# Patient Record
Sex: Male | Born: 2000 | Race: White | Hispanic: Yes | Marital: Single | Smoking: Never smoker
Health system: Southern US, Community
[De-identification: ages and names within clinical notes are randomized; demographics above are authoritative.]

## PROBLEM LIST (undated history)

## (undated) DIAGNOSIS — F32A Depression, unspecified: Secondary | ICD-10-CM

## (undated) DIAGNOSIS — K219 Gastro-esophageal reflux disease without esophagitis: Secondary | ICD-10-CM

## (undated) DIAGNOSIS — F419 Anxiety disorder, unspecified: Secondary | ICD-10-CM

## (undated) HISTORY — DX: Gastro-esophageal reflux disease without esophagitis: K21.9

## (undated) HISTORY — DX: Anxiety disorder, unspecified: F41.9

## (undated) HISTORY — PX: WISDOM TOOTH EXTRACTION: SHX21

## (undated) HISTORY — DX: Depression, unspecified: F32.A

---
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
## 2018-10-05 ENCOUNTER — Emergency Department
Admission: EM | Admit: 2018-10-05 | Discharge: 2018-10-06 | Disposition: A | Discharge status: Home / Self Care | Payer: Medicaid Other | Attending: Emergency Medicine | Admitting: Emergency Medicine

## 2018-10-05 ENCOUNTER — Other Ambulatory Visit: Payer: Self-pay

## 2018-10-05 DIAGNOSIS — J060 Acute laryngopharyngitis: Secondary | ICD-10-CM | POA: Diagnosis not present

## 2018-10-05 DIAGNOSIS — R070 Pain in throat: Secondary | ICD-10-CM | POA: Diagnosis present

## 2018-10-05 LAB — GROUP A STREP BY PCR: Group A Strep by PCR: NOT DETECTED

## 2018-10-05 NOTE — ED Triage Notes (Signed)
Pt states sore throat for one week. Pt with hoarse voice noted. Pt states he believes he has had a fever this week. Red tonsils noted.

## 2018-10-06 ENCOUNTER — Emergency Department: Payer: Medicaid Other

## 2018-10-06 MED ORDER — ACETAMINOPHEN 500 MG PO TABS
1000.0000 mg | ORAL_TABLET | Freq: Once | ORAL | Status: AC
  Administered 2018-10-06: 1000 mg via ORAL
  Filled 2018-10-06: qty 2

## 2018-10-06 MED ORDER — IBUPROFEN 600 MG PO TABS
600.0000 mg | ORAL_TABLET | Freq: Once | ORAL | Status: AC
  Administered 2018-10-06: 600 mg via ORAL
  Filled 2018-10-06: qty 1

## 2018-10-06 MED ORDER — OXYMETAZOLINE HCL 0.05 % NA SOLN
1.0000 | Freq: Once | NASAL | Status: AC
  Administered 2018-10-06: 1 via NASAL
  Filled 2018-10-06: qty 15

## 2018-10-06 MED ORDER — DEXAMETHASONE 4 MG PO TABS
12.0000 mg | ORAL_TABLET | Freq: Once | ORAL | Status: AC
  Administered 2018-10-06: 12 mg via ORAL
  Filled 2018-10-06: qty 3

## 2018-10-06 NOTE — ED Provider Notes (Signed)
[facility] Emergency Department Provider Note  ____________________________________________   First MD Initiated Contact with Patient 10/06/18 [phone]     (approximate)  I have reviewed the triage vital signs and the nursing notes.   HISTORY  Chief Complaint Sore Throat   HPI Evan Leach is a 17 y.o. male who self presents to the emergency department with roughly 1 week of hoarse voice and dry cough.  He says he has felt febrile earlier this week although has not measured a temperature at home as he does not have a thermometer.  Has had dry cough that is intermittent but seems to be worse at night.  His pain in his throat is sharp aching moderate severity worse with swallowing and improved when not.  He has no past medical history and takes no medications.  He is fully vaccinated.    No past medical history on file.  There are no active problems to display for this patient.     Prior to Admission medications   Not on File    Allergies Patient has no known allergies.  No family history on file.  Social History Social History   Tobacco Use  . Smoking status: Not on file  Substance Use Topics  . Alcohol use: Not on file  . Drug use: Not on file    Review of Systems Constitutional: Positive for fevers negative for chills Eyes: No visual changes. ENT: Positive for sore throat. Cardiovascular: Denies chest pain. Respiratory: Positive for cough negative for shortness of breath Gastrointestinal: No abdominal pain.  No nausea, no vomiting.   Genitourinary: Negative for dysuria. Musculoskeletal: Negative for back pain. Skin: Negative for rash. Neurological: Positive for headache   ____________________________________________   PHYSICAL EXAM:  VITAL SIGNS: ED Triage Vitals  Enc Vitals Group     BP 10/05/18 2256 (!) 144/90     Pulse Rate 10/05/18 2256 (!) 52     Resp 10/05/18 2256 16     Temp 10/05/18 2256 98.3 F (36.8 C)     Temp  Source 10/05/18 2256 Oral     SpO2 10/05/18 2256 99 %     Weight 10/05/18 2257 185 lb (83.9 kg)     Height 10/05/18 2257 5\' 9"  (1.753 m)     Head Circumference --      Peak Flow --      Pain Score 10/05/18 2257 6     Pain Loc --      Pain Edu? --      Excl. in GC? --     Constitutional: Alert and oriented x4 speaks in full clear sentences although with hoarse sounding voice.  No drooling.  No diaphoresis Eyes: PERRL EOMI. Head: Atraumatic. Nose: No congestion/rhinnorhea. Mouth/Throat: No trismus uvula midline mild pharyngeal erythema with no exudate.  Mild anterior lymphadenopathy Neck: No stridor.   Cardiovascular: Bradycardic rate, regular rhythm. Grossly normal heart sounds.  Good peripheral circulation. Respiratory: Normal respiratory effort.  No retractions. Lungs CTAB and moving good air Gastrointestinal: Soft nontender Musculoskeletal: No lower extremity edema   Neurologic:  Normal speech and language. No gross focal neurologic deficits are appreciated. Skin:  Skin is warm, dry and intact. No rash noted. Psychiatric: Mood and affect are normal. Speech and behavior are normal.    ____________________________________________   DIFFERENTIAL includes but not limited to  Strep pharyngitis, viral pharyngitis, mononucleosis, pneumonia, upper respiratory tract infection ____________________________________________   LABS (all labs ordered are listed, but only abnormal results are displayed)  Labs Reviewed  GROUP A STREP BY PCR    Lab work reviewed by me is negative for strep  __________________________________________  EKG   ____________________________________________  RADIOLOGY  X-ray reviewed by me with no acute disease ____________________________________________   PROCEDURES  Procedure(s) performed: no  Procedures  Critical Care performed: no  ____________________________________________   INITIAL IMPRESSION / ASSESSMENT AND PLAN / ED  COURSE  Pertinent labs & imaging results that were available during my care of the patient were reviewed by me and considered in my medical decision making (see chart for details).   As part of my medical decision making, I reviewed the following data within the electronic MEDICAL RECORD NUMBER History obtained from family if available, nursing notes, old chart and ekg, as well as notes from prior ED visits.  Patient comes to the emergency department with 1 week of dry cough subjective fever and sore throat.  He is strep negative.  Given the duration of his symptoms I obtained a chest x-ray which is negative for consolidation.  Given ibuprofen Tylenol and dexamethasone along with Afrin for his congestion with improvement in symptoms.  Educated on the predicted clinical course and strict return precautions have been given.      ____________________________________________   FINAL CLINICAL IMPRESSION(S) / ED DIAGNOSES  Final diagnoses:  Acute laryngopharyngitis      NEW MEDICATIONS STARTED DURING THIS VISIT:  There are no discharge medications for this patient.    Note:  This document was prepared using Dragon voice recognition software and may include unintentional dictation errors.     Merrily Brittleifenbark, Courtany Mcmurphy, MD 10/07/18 1109

## 2018-10-06 NOTE — ED Notes (Signed)
Pt to xray

## 2018-10-06 NOTE — Discharge Instructions (Signed)
It was a pleasure to take care of you today, and thank you for coming to our emergency department.  If you have any questions or concerns before leaving please ask the nurse to grab me and I'm more than happy to go through your aftercare instructions again.  If you have any concerns once you are home that you are not improving or are in fact getting worse before you can make it to your follow-up appointment, please do not hesitate to call [phone] and come back for further evaluation.  Merrily BrittleNeil Lanise Mergen, MD  Results for orders placed or performed during the hospital encounter of 10/05/18  Group A Strep by PCR  Result Value Ref Range   Group A Strep by PCR NOT DETECTED NOT DETECTED   Dg Chest 2 View  Result Date: 10/06/2018 CLINICAL DATA:  Initial evaluation for acute shortness of breath, concern for pneumonia. EXAM: CHEST - 2 VIEW COMPARISON:  None. FINDINGS: The cardiac and mediastinal silhouettes are within normal limits. The lungs are normally inflated. No airspace consolidation, pleural effusion, or pulmonary edema is identified. There is no pneumothorax. No acute osseous abnormality identified. IMPRESSION: No radiographic evidence for active cardiopulmonary disease. Electronically Signed   By: Rise MuBenjamin  McClintock M.D.   On: 10/06/2018 01:31

## 2018-10-06 NOTE — ED Notes (Signed)
Pt returned from xray

## 2018-11-28 ENCOUNTER — Encounter: Payer: Self-pay | Admitting: Adult Health

## 2018-11-28 ENCOUNTER — Encounter (INDEPENDENT_AMBULATORY_CARE_PROVIDER_SITE_OTHER): Payer: Self-pay

## 2018-11-28 ENCOUNTER — Ambulatory Visit (INDEPENDENT_AMBULATORY_CARE_PROVIDER_SITE_OTHER): Admitting: Adult Health

## 2018-11-28 VITALS — BP 110/70 | HR 86 | Resp 16 | Ht 70.0 in | Wt 189.0 lb

## 2018-11-28 DIAGNOSIS — M5441 Lumbago with sciatica, right side: Secondary | ICD-10-CM

## 2018-11-28 DIAGNOSIS — M898X6 Other specified disorders of bone, lower leg: Secondary | ICD-10-CM | POA: Diagnosis not present

## 2018-11-28 DIAGNOSIS — G8929 Other chronic pain: Secondary | ICD-10-CM

## 2018-11-28 DIAGNOSIS — M25571 Pain in right ankle and joints of right foot: Secondary | ICD-10-CM

## 2018-11-28 DIAGNOSIS — Z113 Encounter for screening for infections with a predominantly sexual mode of transmission: Secondary | ICD-10-CM

## 2018-11-28 DIAGNOSIS — M5442 Lumbago with sciatica, left side: Secondary | ICD-10-CM | POA: Diagnosis not present

## 2018-11-28 DIAGNOSIS — M25572 Pain in left ankle and joints of left foot: Secondary | ICD-10-CM

## 2018-11-28 NOTE — Progress Notes (Signed)
[facility] [address] [place], [place] [address]  Internal MEDICINE  Office Visit Note  Patient Name: Evan Leach  [id number]  [id number]  Date of Service: 12/15/2018   Complaints/HPI Pt is here for establishment of PCP. Chief Complaint  Patient presents with  . New Patient (Initial Visit)  . Knee Pain    both  . Back Pain    going on for 1 year  . Foot Pain   HPI Patient is here to establish care with our practice.  He is a 18 year old male who is currently serving in the [employer].  He reports he has had chronic back pain since November 2018.  It is intermittent at times however is fairly consistent especially with weather changes.  He reports bilateral foot pain as well as bilateral shin pain over his tibias.  This is all been ongoing and he want to make sure it is in his medical record due to serving in the [employer].  He also reports that he is planning to marry his fiance in the next year and he would like to be tested for sickle transmitted infections at this visit also.    Current Medication: No outpatient encounter medications on file as of 11/28/2018.   No facility-administered encounter medications on file as of 11/28/2018.     Surgical History:   Medical History: No past medical history on file.  Family History: Family History  Problem Relation Age of Onset  . Diabetes Maternal Grandmother   . Diabetes Maternal Grandfather   . Diabetes Paternal Grandmother     Social History   Socioeconomic History  . Marital status: Single    Spouse name: Not on file  . Number of children: Not on file  . Years of education: Not on file  . Highest education level: Not on file  Occupational History  . Not on file  Social Needs  . Financial resource strain: Not on file  . Food insecurity:    Worry: Not on file    Inability: Not on file  . Transportation needs:    Medical: Not on file    Non-medical: Not on file  Tobacco Use  . Smoking  status: Never Smoker  . Smokeless tobacco: Never Used  Substance and Sexual Activity  . Alcohol use: Yes    Comment: social  . Drug use: Never  . Sexual activity: Not on file  Lifestyle  . Physical activity:    Days per week: Not on file    Minutes per session: Not on file  . Stress: Not on file  Relationships  . Social connections:    Talks on phone: Not on file    Gets together: Not on file    Attends religious service: Not on file    Active member of club or organization: Not on file    Attends meetings of clubs or organizations: Not on file    Relationship status: Not on file  . Intimate partner violence:    Fear of current or ex partner: Not on file    Emotionally abused: Not on file    Physically abused: Not on file    Forced sexual activity: Not on file  Other Topics Concern  . Not on file  Social History Narrative  . Not on file     Review of Systems  Constitutional: Negative.  Negative for chills, fatigue and unexpected weight change.  HENT: Negative.  Negative for congestion, rhinorrhea, sneezing and sore throat.  Eyes: Negative for redness.  Respiratory: Negative.  Negative for cough, chest tightness and shortness of breath.   Cardiovascular: Negative.  Negative for chest pain and palpitations.  Gastrointestinal: Negative.  Negative for abdominal pain, constipation, diarrhea, nausea and vomiting.  Endocrine: Negative.   Genitourinary: Negative.  Negative for dysuria and frequency.  Musculoskeletal: Negative.  Negative for arthralgias, back pain, joint swelling and neck pain.  Skin: Negative.  Negative for rash.  Allergic/Immunologic: Negative.   Neurological: Negative.  Negative for tremors and numbness.  Hematological: Negative for adenopathy. Does not bruise/bleed easily.  Psychiatric/Behavioral: Negative.  Negative for behavioral problems, sleep disturbance and suicidal ideas. The patient is not nervous/anxious.     Vital Signs: BP 110/70   Pulse 86    Resp 16   Ht 5\' 10"  (1.778 m)   Wt 189 lb (85.7 kg)   SpO2 98%   BMI 27.12 kg/m    Physical Exam Vitals signs and nursing note reviewed.  Constitutional:      General: He is not in acute distress.    Appearance: He is well-developed. He is not diaphoretic.  HENT:     Head: Normocephalic and atraumatic.     Mouth/Throat:     Pharynx: No oropharyngeal exudate.  Eyes:     Pupils: Pupils are equal, round, and reactive to light.  Neck:     Musculoskeletal: Normal range of motion and neck supple.     Thyroid: No thyromegaly.     Vascular: No JVD.     Trachea: No tracheal deviation.  Cardiovascular:     Rate and Rhythm: Normal rate and regular rhythm.     Heart sounds: Normal heart sounds. No murmur. No friction rub. No gallop.   Pulmonary:     Effort: Pulmonary effort is normal. No respiratory distress.     Breath sounds: Normal breath sounds. No wheezing or rales.  Chest:     Chest wall: No tenderness.  Abdominal:     Palpations: Abdomen is soft.     Tenderness: There is no abdominal tenderness. There is no guarding.  Musculoskeletal: Normal range of motion.  Lymphadenopathy:     Cervical: No cervical adenopathy.  Skin:    General: Skin is warm and dry.  Neurological:     Mental Status: He is alert and oriented to person, place, and time.     Cranial Nerves: No cranial nerve deficit.  Psychiatric:        Behavior: Behavior normal.        Thought Content: Thought content normal.        Judgment: Judgment normal.    Assessment/Plan: 1. Chronic bilateral low back pain with bilateral sciatica Patient has chronic back pain with bilateral sciatica.  He reports that the pain generally is a 4 out of 10 at its worst.  He does not wish to take any medications at this time and is declining x-ray evaluation currently.  2. Bilateral tibial pain Patient has ongoing intermittent bilateral tibial pain.  14 to monitor in the future.  Patient declined x-rays or further evaluation at  this time  3. Chronic pain of both ankles Patient has intermittent ongoing pain in both his ankles and fee likely due to his [employer], [employer] career.  We will continue to monitor the future.  Patient declined x-rays or evaluation at this time.  4. Encounter for screening examination for sexually transmitted disease - Hepatitis c antibody (reflex) - RPR - HSV(herpes simplex vrs) 1+2 ab-IgG - Chlamydia/Gonococcus/Trichomonas, NAA  General  Counseling: Wallis BambergLarry verbalizes understanding of the findings of todays visit and agrees with plan of treatment. I have discussed any further diagnostic evaluation that may be needed or ordered today. We also reviewed his medications today. he has been encouraged to call the office with any questions or concerns that should arise related to todays visit.  Orders Placed This Encounter  Procedures  . Chlamydia/Gonococcus/Trichomonas, NAA  . Hepatitis c antibody (reflex)  . RPR  . HSV(herpes simplex vrs) 1+2 ab-IgG    No orders of the defined types were placed in this encounter.   Time spent: 25 Minutes   This patient was seen by Blima LedgerAdam Jekhi Bolin AGNP-C in Collaboration with Dr Lyndon CodeFozia M Khan as a part of collaborative care agreement  Johnna AcostaAdam J. Tradarius Reinwald AGNP-C Internal Medicine

## 2018-12-02 LAB — CHLAMYDIA/GONOCOCCUS/TRICHOMONAS, NAA
Chlamydia by NAA: NEGATIVE
Gonococcus by NAA: NEGATIVE
Trich vag by NAA: NEGATIVE

## 2018-12-15 ENCOUNTER — Encounter: Payer: Self-pay | Admitting: Adult Health

## 2018-12-26 ENCOUNTER — Telehealth: Payer: Self-pay | Admitting: Adult Health

## 2018-12-26 NOTE — Telephone Encounter (Signed)
Left message to reschedule 01/10/2019 [email or web address].jw

## 2018-12-28 LAB — LIPID PANEL WITH LDL/HDL RATIO
CHOLESTEROL TOTAL: 133 mg/dL (ref 100–169)
HDL: 24 mg/dL — ABNORMAL LOW (ref >39)
LDL Calculated: 75 mg/dL (ref 0–109)
LDl/HDL Ratio: 3.1 ratio (ref 0.0–3.6)
Triglycerides: 171 mg/dL — ABNORMAL HIGH (ref 0–89)
VLDL CHOLESTEROL CAL: 34 mg/dL (ref 5–40)

## 2018-12-28 LAB — CBC WITH DIFFERENTIAL/PLATELET
BASOS ABS: 0 10*3/uL (ref 0.0–0.2)
Basos: 0 %
EOS (ABSOLUTE): 0.1 10*3/uL (ref 0.0–0.4)
Eos: 1 %
HEMOGLOBIN: 15.8 g/dL (ref 13.0–17.7)
Hematocrit: 45.8 % (ref 37.5–51.0)
Immature Grans (Abs): 0 10*3/uL (ref 0.0–0.1)
Immature Granulocytes: 0 %
LYMPHS: 19 %
Lymphocytes Absolute: 1.7 10*3/uL (ref 0.7–3.1)
MCH: 31.4 pg (ref 26.6–33.0)
MCHC: 34.5 g/dL (ref 31.5–35.7)
MCV: 91 fL (ref 79–97)
MONOCYTES: 7 %
Monocytes Absolute: 0.6 10*3/uL (ref 0.1–0.9)
Neutrophils Absolute: 6.5 10*3/uL (ref 1.4–7.0)
Neutrophils: 73 %
Platelets: 315 10*3/uL (ref 150–450)
RBC: 5.03 x10E6/uL (ref 4.14–5.80)
RDW: 12.6 % (ref 11.6–15.4)
WBC: 8.9 10*3/uL (ref 3.4–10.8)

## 2018-12-28 LAB — COMPREHENSIVE METABOLIC PANEL
ALT: 29 IU/L (ref 0–44)
AST: 29 IU/L (ref 0–40)
Albumin/Globulin Ratio: 2 (ref 1.2–2.2)
Albumin: 5.1 g/dL (ref 4.1–5.2)
Alkaline Phosphatase: 108 IU/L (ref 39–117)
BILIRUBIN TOTAL: 1.3 mg/dL — AB (ref 0.0–1.2)
BUN/Creatinine Ratio: 10 (ref 9–20)
BUN: 9 mg/dL (ref 6–20)
CHLORIDE: 101 mmol/L (ref 96–106)
CO2: 22 mmol/L (ref 20–29)
Calcium: 9.7 mg/dL (ref 8.7–10.2)
Creatinine, Ser: 0.89 mg/dL (ref 0.76–1.27)
GFR calc Af Amer: 143 mL/min/{1.73_m2} (ref >59)
GFR calc non Af Amer: 124 mL/min/{1.73_m2} (ref >59)
GLUCOSE: 78 mg/dL (ref 65–99)
Globulin, Total: 2.5 g/dL (ref 1.5–4.5)
Potassium: 4.6 mmol/L (ref 3.5–5.2)
Sodium: 140 mmol/L (ref 134–144)
Total Protein: 7.6 g/dL (ref 6.0–8.5)

## 2018-12-28 LAB — HEPATITIS C ANTIBODY (REFLEX): HCV Ab: <0.1 s/co ratio (ref 0.0–0.9)

## 2018-12-28 LAB — RPR: RPR Ser Ql: NONREACTIVE

## 2018-12-28 LAB — HCV COMMENT:

## 2018-12-28 LAB — HSV(HERPES SIMPLEX VRS) I + II AB-IGG
HSV 1 Glycoprotein G Ab, IgG: <0.91 index (ref 0.00–0.90)
HSV 2 IgG, Type Spec: <0.91 index (ref 0.00–0.90)

## 2018-12-28 LAB — TSH: TSH: 2.45 u[IU]/mL (ref 0.450–4.500)

## 2018-12-28 LAB — T4, FREE: Free T4: 1.15 ng/dL (ref 0.93–1.60)

## 2018-12-31 ENCOUNTER — Telehealth: Payer: Self-pay | Admitting: Adult Health

## 2018-12-31 NOTE — Telephone Encounter (Signed)
Left message for patient that labs are good , call back with any questions

## 2018-12-31 NOTE — Telephone Encounter (Signed)
-----   Message from Johnna Acosta, NP sent at 12/31/2018  9:15 AM EST ----- Mr. [place] STI testing is all negative.  Syphilis, Gon/Chyla/trich, Herpes 1 and 2

## 2019-01-09 ENCOUNTER — Ambulatory Visit
Admission: RE | Admit: 2019-01-09 | Discharge: 2019-01-09 | Disposition: A | Discharge status: Home / Self Care | Source: Ambulatory Visit | Attending: Adult Health | Admitting: Adult Health

## 2019-01-09 ENCOUNTER — Ambulatory Visit
Admission: RE | Admit: 2019-01-09 | Discharge: 2019-01-09 | Disposition: A | Discharge status: Home / Self Care | Attending: Adult Health | Admitting: Adult Health

## 2019-01-09 ENCOUNTER — Ambulatory Visit (INDEPENDENT_AMBULATORY_CARE_PROVIDER_SITE_OTHER): Admitting: Adult Health

## 2019-01-09 ENCOUNTER — Encounter: Payer: Self-pay | Admitting: Adult Health

## 2019-01-09 ENCOUNTER — Other Ambulatory Visit: Payer: Self-pay

## 2019-01-09 VITALS — BP 110/78 | HR 52 | Resp 16 | Ht 70.0 in | Wt 182.0 lb

## 2019-01-09 DIAGNOSIS — M5442 Lumbago with sciatica, left side: Principal | ICD-10-CM

## 2019-01-09 DIAGNOSIS — Z0001 Encounter for general adult medical examination with abnormal findings: Secondary | ICD-10-CM

## 2019-01-09 DIAGNOSIS — S86899A Other injury of other muscle(s) and tendon(s) at lower leg level, unspecified leg, initial encounter: Secondary | ICD-10-CM

## 2019-01-09 DIAGNOSIS — E785 Hyperlipidemia, unspecified: Secondary | ICD-10-CM | POA: Diagnosis not present

## 2019-01-09 DIAGNOSIS — M5441 Lumbago with sciatica, right side: Secondary | ICD-10-CM

## 2019-01-09 DIAGNOSIS — G8929 Other chronic pain: Secondary | ICD-10-CM

## 2019-01-09 DIAGNOSIS — R300 Dysuria: Secondary | ICD-10-CM

## 2019-01-09 NOTE — Progress Notes (Signed)
[facility] [address] [place], [place] [address]  Internal MEDICINE  Office Visit Note  Patient Name: Evan Leach  [id number]  [id number]  Date of Service: 01/09/2019  Chief Complaint  Patient presents with  . Annual Exam    back pain still there      HPI Pt is here for routine health maintenance examination. PT is a well appearing 18 yo male.  He is employed full time in [employer]. He currently takes not medications and has no significant medical history to speak of. He has been dealing with some ongoing bilateral ankle pain, as well as some lower back pain.  Pt had lab results drawn on 12/27/18, we discussed that his triglycerides are elevated.  He reports eating some chocolate before going in to have his blood drawn.  Will repeat his lipid panel before his next visit. PT also complains of shin splints. He reports he has been running more lately with the [employer] due to some extra training.  He says he has had some issues in the past with this and it usually resolves on its on with proper rest and stretching.   Current Medication: No outpatient encounter medications on file as of 01/09/2019.   No facility-administered encounter medications on file as of 01/09/2019.     Surgical History: Past Surgical History:  Procedure Laterality Date  . WISDOM TOOTH EXTRACTION      Medical History: History reviewed. No pertinent past medical history.  Family History: Family History  Problem Relation Age of Onset  . Diabetes Maternal Grandmother   . Diabetes Maternal Grandfather   . Diabetes Paternal Grandmother       Review of Systems  Constitutional: Negative.  Negative for chills, fatigue and unexpected weight change.  HENT: Negative.  Negative for congestion, rhinorrhea, sneezing and sore throat.   Eyes: Negative for redness.  Respiratory: Negative.  Negative for cough, chest tightness and shortness of breath.   Cardiovascular: Negative.  Negative  for chest pain and palpitations.  Gastrointestinal: Negative.  Negative for abdominal pain, constipation, diarrhea, nausea and vomiting.  Endocrine: Negative.   Genitourinary: Negative.  Negative for dysuria and frequency.  Musculoskeletal: Negative.  Negative for arthralgias, back pain, joint swelling and neck pain.  Skin: Negative.  Negative for rash.  Allergic/Immunologic: Negative.   Neurological: Negative.  Negative for tremors and numbness.  Hematological: Negative for adenopathy. Does not bruise/bleed easily.  Psychiatric/Behavioral: Negative.  Negative for behavioral problems, sleep disturbance and suicidal ideas. The patient is not nervous/anxious.      Vital Signs: BP 110/78 (BP Location: Left Arm, Patient Position: Sitting, Cuff Size: Normal)   Pulse (!) 52   Resp 16   Ht 5\' 10"  (1.778 m)   Wt 182 lb (82.6 kg)   SpO2 97%   BMI 26.11 kg/m    Physical Exam Vitals signs and nursing note reviewed.  Constitutional:      General: He is not in acute distress.    Appearance: He is well-developed. He is not diaphoretic.  HENT:     Head: Normocephalic and atraumatic.     Mouth/Throat:     Pharynx: No oropharyngeal exudate.  Eyes:     Pupils: Pupils are equal, round, and reactive to light.  Neck:     Musculoskeletal: Normal range of motion and neck supple.     Thyroid: No thyromegaly.     Vascular: No JVD.     Trachea: No tracheal deviation.  Cardiovascular:  Rate and Rhythm: Normal rate and regular rhythm.     Heart sounds: Normal heart sounds. No murmur. No friction rub. No gallop.   Pulmonary:     Effort: Pulmonary effort is normal. No respiratory distress.     Breath sounds: Normal breath sounds. No wheezing or rales.  Chest:     Chest wall: No tenderness.  Abdominal:     Palpations: Abdomen is soft.     Tenderness: There is no abdominal tenderness. There is no guarding.     Hernia: There is no hernia in the right inguinal area or left inguinal area.   Genitourinary:    Penis: Normal.      Scrotum/Testes: Cremasteric reflex is present.        Right: Mass, tenderness or swelling not present.        Left: Mass, tenderness or swelling not present.     Epididymis:     Right: Normal.     Left: Normal.  Musculoskeletal: Normal range of motion.  Lymphadenopathy:     Cervical: No cervical adenopathy.     Lower Body: No right inguinal adenopathy. No left inguinal adenopathy.  Skin:    General: Skin is warm and dry.  Neurological:     Mental Status: He is alert and oriented to person, place, and time.     Cranial Nerves: No cranial nerve deficit.  Psychiatric:        Behavior: Behavior normal.        Thought Content: Thought content normal.        Judgment: Judgment normal.      LABS: Recent Results (from the past 2160 hour(s))  Chlamydia/Gonococcus/Trichomonas, NAA     Status: None   Collection Time: 11/28/18  3:43 PM  Result Value Ref Range   Chlamydia by NAA Negative Negative   Gonococcus by NAA Negative Negative   Trich vag by NAA Negative Negative  Hepatitis c antibody (reflex)     Status: None   Collection Time: 12/27/18  9:29 AM  Result Value Ref Range   HCV Ab <0.1 0.0 - 0.9 s/co ratio  RPR     Status: None   Collection Time: 12/27/18  9:29 AM  Result Value Ref Range   RPR Ser Ql Non Reactive Non Reactive  HSV(herpes simplex vrs) 1+2 ab-IgG     Status: None   Collection Time: 12/27/18  9:29 AM  Result Value Ref Range   HSV 1 Glycoprotein G Ab, IgG <0.91 0.00 - 0.90 index    Comment:                                  Negative        <0.91                                  Equivocal 0.91 - 1.09                                  Positive        >1.09  Note: Negative indicates no antibodies detected to  HSV-1. Equivocal may suggest early infection.  If  clinically appropriate, retest at later date. Positive  indicates antibodies detected to HSV-1.    HSV 2 IgG, Type Spec <0.91 0.00 - 0.90 index  Comment:                                   Negative        <0.91                                  Equivocal 0.91 - 1.09                                  Positive        >1.09  Note: Negative indicates no antibodies detected to  HSV-2. Equivocal may suggest early infection.  If  clinically appropriate, retest at later date. Positive  indicates antibodies detected to HSV-2.   CBC with Differential/Platelet     Status: None   Collection Time: 12/27/18  9:29 AM  Result Value Ref Range   WBC 8.9 3.4 - 10.8 x10E3/uL   RBC 5.03 4.14 - 5.80 x10E6/uL   Hemoglobin 15.8 13.0 - 17.7 g/dL   Hematocrit 37.2 90.2 - 51.0 %   MCV 91 79 - 97 fL   MCH 31.4 26.6 - 33.0 pg   MCHC 34.5 31.5 - 35.7 g/dL   RDW 11.1 55.2 - 08.0 %   Platelets 315 150 - 450 x10E3/uL   Neutrophils 73 Not Estab. %   Lymphs 19 Not Estab. %   Monocytes 7 Not Estab. %   Eos 1 Not Estab. %   Basos 0 Not Estab. %   Neutrophils Absolute 6.5 1.4 - 7.0 x10E3/uL   Lymphocytes Absolute 1.7 0.7 - 3.1 x10E3/uL   Monocytes Absolute 0.6 0.1 - 0.9 x10E3/uL   EOS (ABSOLUTE) 0.1 0.0 - 0.4 x10E3/uL   Basophils Absolute 0.0 0.0 - 0.2 x10E3/uL   Immature Granulocytes 0 Not Estab. %   Immature Grans (Abs) 0.0 0.0 - 0.1 x10E3/uL  Comprehensive metabolic panel     Status: Abnormal   Collection Time: 12/27/18  9:29 AM  Result Value Ref Range   Glucose 78 65 - 99 mg/dL   BUN 9 6 - 20 mg/dL   Creatinine, Ser 2.23 0.76 - 1.27 mg/dL   GFR calc non Af Amer 124 >59 mL/min/1.73   GFR calc Af Amer 143 >59 mL/min/1.73   BUN/Creatinine Ratio 10 9 - 20   Sodium 140 134 - 144 mmol/L   Potassium 4.6 3.5 - 5.2 mmol/L   Chloride 101 96 - 106 mmol/L   CO2 22 20 - 29 mmol/L   Calcium 9.7 8.7 - 10.2 mg/dL   Total Protein 7.6 6.0 - 8.5 g/dL   Albumin 5.1 4.1 - 5.2 g/dL    Comment:               **Please note reference interval change**   Globulin, Total 2.5 1.5 - 4.5 g/dL   Albumin/Globulin Ratio 2.0 1.2 - 2.2   Bilirubin Total 1.3 (H) 0.0 - 1.2 mg/dL   Alkaline Phosphatase  108 39 - 117 IU/L   AST 29 0 - 40 IU/L   ALT 29 0 - 44 IU/L  Lipid Panel With LDL/HDL Ratio     Status: Abnormal   Collection Time: 12/27/18  9:29 AM  Result Value Ref Range   Cholesterol, Total 133 100 - 169 mg/dL   Triglycerides 361 (H) 0 - 89 mg/dL  HDL 24 (L) >39 mg/dL   VLDL Cholesterol Cal 34 5 - 40 mg/dL   LDL Calculated 75 0 - 109 mg/dL   LDl/HDL Ratio 3.1 0.0 - 3.6 ratio    Comment:                                     LDL/HDL Ratio                                             Men  Women                               1/2 Avg.Risk  1.0    1.5                                   Avg.Risk  3.6    3.2                                2X Avg.Risk  6.2    5.0                                3X Avg.Risk  8.0    6.1   T4, free     Status: None   Collection Time: 12/27/18  9:29 AM  Result Value Ref Range   Free T4 1.15 0.93 - 1.60 ng/dL  TSH     Status: None   Collection Time: 12/27/18  9:29 AM  Result Value Ref Range   TSH 2.450 0.450 - 4.500 uIU/mL  HCV Comment:     Status: None   Collection Time: 12/27/18  9:29 AM  Result Value Ref Range   Comment: Comment     Comment: Non reactive HCV antibody screen is consistent with no HCV infection, unless recent infection is suspected or other evidence exists to indicate HCV infection.     Assessment/Plan: 1. Encounter for general adult medical examination with abnormal findings PT is up to date on PHM.   2. Chronic bilateral low back pain with bilateral sciatica Due to patients on going lower back pain, will get xray of lumbar spine.  - DG Lumbar Spine Complete; Future  3. Shin splints, initial encounter We discussed ways to treat and prevent shin splints.  Patient information sheet provided. Discussed R.I.C.E. and stretching before exercising.  Also the importance of rest.   4. Hyperlipidemia, unspecified hyperlipidemia type Pt's triglycerides elevated, will redraw before next visit.   5. Dysuria - UA/M w/rflx Culture,  Routine  General Counseling: kavish lafitte understanding of the findings of todays visit and agrees with plan of treatment. I have discussed any further diagnostic evaluation that may be needed or ordered today. We also reviewed his medications today. he has been encouraged to call the office with any questions or concerns that should arise related to todays visit.   Orders Placed This Encounter  Procedures  . UA/M w/rflx Culture, Routine    No orders of the defined types were placed in this encounter.   Time spent: 25 Minutes  This patient was seen by Orson Gear AGNP-C in Collaboration with Dr Lavera Guise as a part of collaborative care agreement    Kendell Bane AGNP-C Internal Medicine

## 2019-01-09 NOTE — Patient Instructions (Signed)
Shin Splints    Shin splints is a painful condition that is felt in the front or the side of your legs. The muscles, the cord-like structures that connect muscles to bones (tendons), and the thin layer of tissue that covers the shin bone get irritated (inflamed). It can be caused by activities or exercises that are intense. It may also be caused by sports that have sudden starts and stops.  Follow these instructions at home:  Medicines  Take over-the-counter and prescription medicines only as told by your doctor.  Do not drive or use heavy machinery while taking prescription pain medicine.  Injury care         If told, put ice on the painful area. Icing can help to relieve pain and swelling.  Put ice in a plastic bag.  Place a towel between your skin and the bag.  Leave the ice on for 20 minutes, 2-3 times per day.  If told, put heat on the painful area. Do this before exercises, or as told by your doctor. Heat can help to relax your muscles. Use the heat source that your doctor recommends, such as a moist heat pack or a heating pad.  Place a towel between your skin and the heat source.  Leave the heat on for 20-30 minutes.  Take off the heat if your skin gets bright red. This is important.  Massage, stretch, and strengthen the shin area.  Wear compression socks as told by your doctor.  Raise (elevate) your legs above the level of your heart while you are sitting or lying down.  Activity  Rest as needed. Return to activity slowly as told by your doctor.  Do not use your shins to support your body weight when you start exercising again. Try cycling or swimming.  Stop running if you have pain.  Warm up before you exercise.  Run on a flat and firm surface, if possible.  Change the intensity of your exercise slowly.  Increase your running distance slowly. For example, if you are running 5 miles this week, you should only add  mile to your run next week.  General instructions  Wear shoes that have good arch support and  heel support. Your shoes should cushion and support you as you walk or run.  Change your athletic shoes every 6 months, or every 350-450 miles.  Keep all follow-up visits as told by your doctor. This is important.  Contact a doctor if:  You do not feel better.  Your pain gets worse.  You have swelling in your lower leg that gets worse.  Your shin is red and feels warm.  Get help right away if:  You have very bad pain.  You have trouble walking.  Summary  Shin splints is a painful condition that is felt in the front or the side of your legs.  Medicines, rest, and ice may help control pain.  Return to activity slowly as told by your doctor.  Make sure to call your doctor if your problems continue or get worse.  This information is not intended to replace advice given to you by your health care provider. Make sure you discuss any questions you have with your health care provider.  Document Released: 06/28/2011 Document Revised: 11/05/2017 Document Reviewed: 11/05/2017  Elsevier Interactive Patient Education  2019 [employer]

## 2019-01-10 ENCOUNTER — Encounter: Payer: Self-pay | Admitting: Adult Health

## 2019-01-10 LAB — UA/M W/RFLX CULTURE, ROUTINE
Bilirubin, UA: NEGATIVE
Glucose, UA: NEGATIVE
Ketones, UA: NEGATIVE
Leukocytes, UA: NEGATIVE
Nitrite, UA: NEGATIVE
Protein, UA: NEGATIVE
RBC, UA: NEGATIVE
Specific Gravity, UA: 1.007 (ref 1.005–1.030)
Urobilinogen, Ur: 0.2 mg/dL (ref 0.2–1.0)
pH, UA: 8 — ABNORMAL HIGH (ref 5.0–7.5)

## 2019-01-10 LAB — MICROSCOPIC EXAMINATION
Bacteria, UA: NONE SEEN
Casts: NONE SEEN /LPF
Epithelial Cells (non renal): NONE SEEN /HPF (ref 0–10)
RBC, UA: NONE SEEN /HPF (ref 0–2)
WBC, UA: NONE SEEN /HPF (ref 0–5)

## 2019-01-17 ENCOUNTER — Telehealth: Payer: Self-pay | Admitting: Adult Health

## 2019-01-17 NOTE — Telephone Encounter (Signed)
-----   Message from Johnna Acosta, NP sent at 01/16/2019 11:05 AM EDT ----- He has a mild Scoliosis (curvature) of his lumbar spine.  He is in [employer] and likely will not want anything done.  But I can refer him to ortho if he wants.

## 2019-01-17 NOTE — Telephone Encounter (Signed)
PATIENT WAS ADVISED ON XRAY , AND WOULD LIKE TO HAVE REFERRAL TO ORTHO

## 2019-01-20 ENCOUNTER — Other Ambulatory Visit: Payer: Self-pay | Admitting: Adult Health

## 2019-01-20 DIAGNOSIS — M419 Scoliosis, unspecified: Secondary | ICD-10-CM

## 2019-01-20 NOTE — Progress Notes (Signed)
Referral for Orthopedics due to scoliosis on x ray.

## 2019-04-16 ENCOUNTER — Ambulatory Visit: Admitting: Adult Health

## 2019-04-22 ENCOUNTER — Other Ambulatory Visit: Payer: Self-pay

## 2019-04-22 ENCOUNTER — Encounter: Payer: Self-pay | Admitting: Adult Health

## 2019-04-22 ENCOUNTER — Ambulatory Visit (INDEPENDENT_AMBULATORY_CARE_PROVIDER_SITE_OTHER): Admitting: Adult Health

## 2019-04-22 VITALS — Resp 16 | Ht 68.0 in | Wt 182.0 lb

## 2019-04-22 DIAGNOSIS — J988 Other specified respiratory disorders: Secondary | ICD-10-CM | POA: Diagnosis not present

## 2019-04-22 DIAGNOSIS — E781 Pure hyperglyceridemia: Secondary | ICD-10-CM

## 2019-04-22 DIAGNOSIS — M419 Scoliosis, unspecified: Secondary | ICD-10-CM

## 2019-04-22 MED ORDER — AZITHROMYCIN 250 MG PO TABS: ORAL_TABLET | ORAL | 0 refills | Status: DC

## 2019-04-22 MED ORDER — PREDNISONE 10 MG PO TABS: ORAL_TABLET | ORAL | 0 refills | Status: DC

## 2019-04-22 NOTE — Progress Notes (Signed)
[facility] [address] [place], [place] [id number]  Internal MEDICINE  Telephone Visit  Patient Name: Evan CaterLarry Leach  [birth year]  [id number]  Date of Service: 04/22/2019  I connected with the patient at 520 by telephone and verified the patients identity using two identifiers.   I discussed the limitations, risks, security and privacy concerns of performing an evaluation and management service by telephone and the availability of in person appointments. I also discussed with the patient that there may be a patient responsible charge related to the service.  The patient expressed understanding and agrees to proceed.    Chief Complaint  Patient presents with  . Medical Management of Chronic Issues    3 month follow up review xray , cholesterol     HPI  PT is seen via telephone for follow upon x-rays and his cholesterol.  He is also reporting that his fiance tested positive for covid. He is complaining of chest congestion, and some sob intermittently.  He went to [facility] to have a covid test over the weekend, and is awaiting results.  He denies fever, or other symptoms at this time.     Current Medication: No outpatient encounter medications on file as of 04/22/2019.   No facility-administered encounter medications on file as of 04/22/2019.     Surgical History: Past Surgical History:  Procedure Laterality Date  . WISDOM TOOTH EXTRACTION      Medical History: History reviewed. No pertinent past medical history.  Family History: Family History  Problem Relation Age of Onset  . Diabetes Maternal Grandmother   . Diabetes Maternal Grandfather   . Diabetes Paternal Grandmother     Social History   Socioeconomic History  . Marital status: Single    Spouse name: Not on file  . Number of children: Not on file  . Years of education: Not on file  . Highest education level: Not on file  Occupational History  . Not on file  Social Needs  . Financial resource strain: Not  on file  . Food insecurity    Worry: Not on file    Inability: Not on file  . Transportation needs    Medical: Not on file    Non-medical: Not on file  Tobacco Use  . Smoking status: Never Smoker  . Smokeless tobacco: Never Used  Substance and Sexual Activity  . Alcohol use: Yes    Comment: social  . Drug use: Never  . Sexual activity: Not on file  Lifestyle  . Physical activity    Days per week: Not on file    Minutes per session: Not on file  . Stress: Not on file  Relationships  . Social Musicianconnections    Talks on phone: Not on file    Gets together: Not on file    Attends religious service: Not on file    Active member of club or organization: Not on file    Attends meetings of clubs or organizations: Not on file    Relationship status: Not on file  . Intimate partner violence    Fear of current or ex partner: Not on file    Emotionally abused: Not on file    Physically abused: Not on file    Forced sexual activity: Not on file  Other Topics Concern  . Not on file  Social History Narrative  . Not on file      Review of Systems  Constitutional: Negative.  Negative for chills, fatigue and unexpected weight  change.  HENT: Negative.  Negative for congestion, rhinorrhea, sneezing and sore throat.   Eyes: Negative for redness.  Respiratory: Negative.  Negative for cough, chest tightness and shortness of breath.   Cardiovascular: Negative.  Negative for chest pain and palpitations.  Gastrointestinal: Negative.  Negative for abdominal pain, constipation, diarrhea, nausea and vomiting.  Endocrine: Negative.   Genitourinary: Negative.  Negative for dysuria and frequency.  Musculoskeletal: Negative.  Negative for arthralgias, back pain, joint swelling and neck pain.  Skin: Negative.  Negative for rash.  Allergic/Immunologic: Negative.   Neurological: Negative.  Negative for tremors and numbness.  Hematological: Negative for adenopathy. Does not bruise/bleed easily.   Psychiatric/Behavioral: Negative.  Negative for behavioral problems, sleep disturbance and suicidal ideas. The patient is not nervous/anxious.     Vital Signs: Resp 16   Ht 5\' 8"  (1.727 m)   Wt 182 lb (82.6 kg)   BMI 27.67 kg/m    Observation/Objective:  Well appearing.  NAD noted.    Assessment/Plan: 1. Respiratory infection Advised patient to take entire course of antibiotics as prescribed with food. Pt should return to clinic in 7-10 days if symptoms fail to improve or new symptoms develop.  - azithromycin (ZITHROMAX) 250 MG tablet; Take as directed  Dispense: 6 tablet; Refill: 0 - predniSONE (DELTASONE) 10 MG tablet; Use per dose pack  Dispense: 21 tablet; Refill: 0  2. High blood triglycerides Re-ordred patients lipid panel as he has not had a chance to get it done yet.  - Lipid Panel With LDL/HDL Ratio  3. Scoliosis of lumbar spine, unspecified scoliosis type Currently doing physical thearpy.  General Counseling: Evan Leach understanding of the findings of today's phone visit and agrees with plan of treatment. I have discussed any further diagnostic evaluation that may be needed or ordered today. We also reviewed his medications today. he has been encouraged to call the office with any questions or concerns that should arise related to todays visit.    No orders of the defined types were placed in this encounter.   No orders of the defined types were placed in this encounter.   Time spent: [facility] Internal medicine

## 2019-11-08 IMAGING — CR DG CHEST 2V
2 series · 2 of 2 positions shown · non-contrast
Comparison: None.

CLINICAL DATA: Initial evaluation for acute shortness of breath,
concern for pneumonia.

EXAM:
CHEST - 2 VIEW

[chest pa]
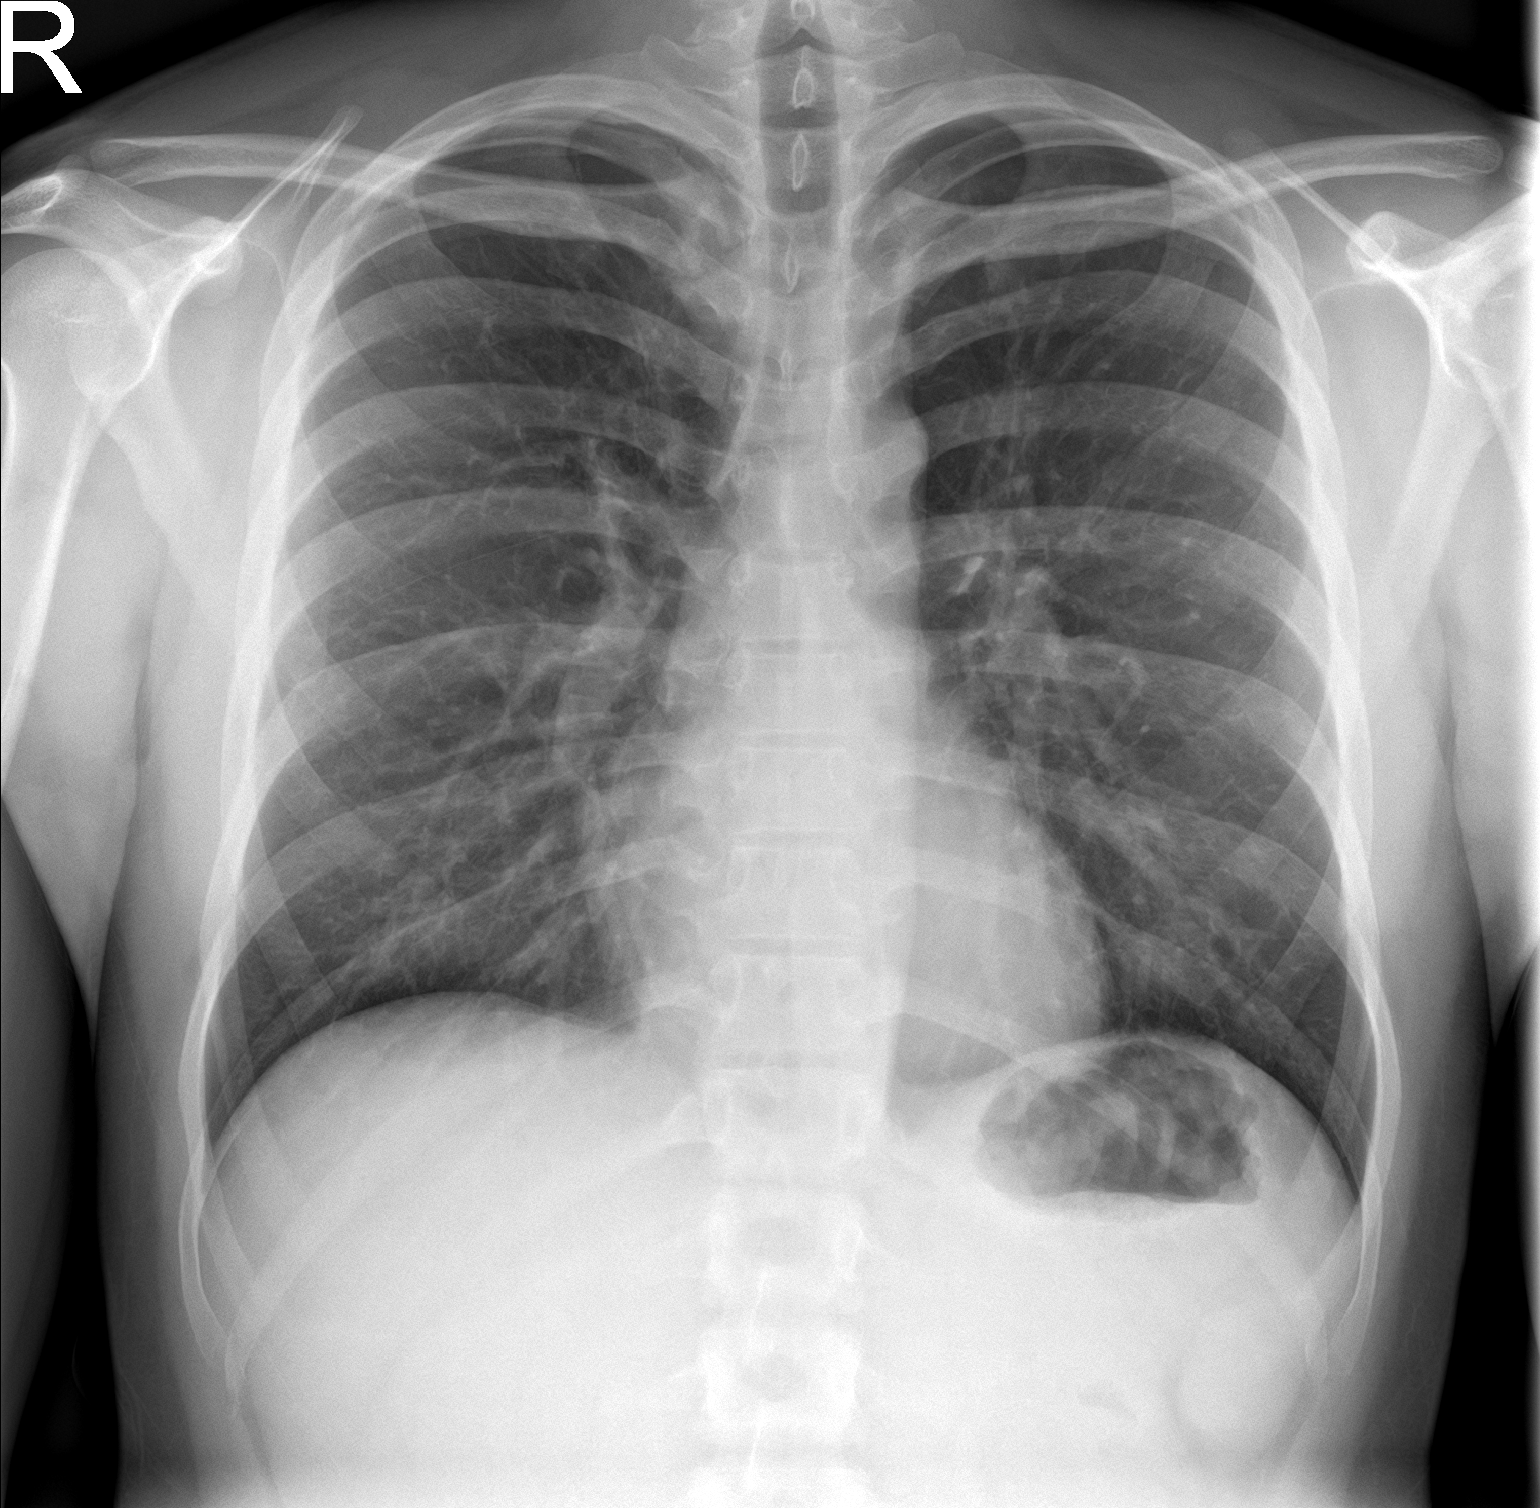

[chest lat]
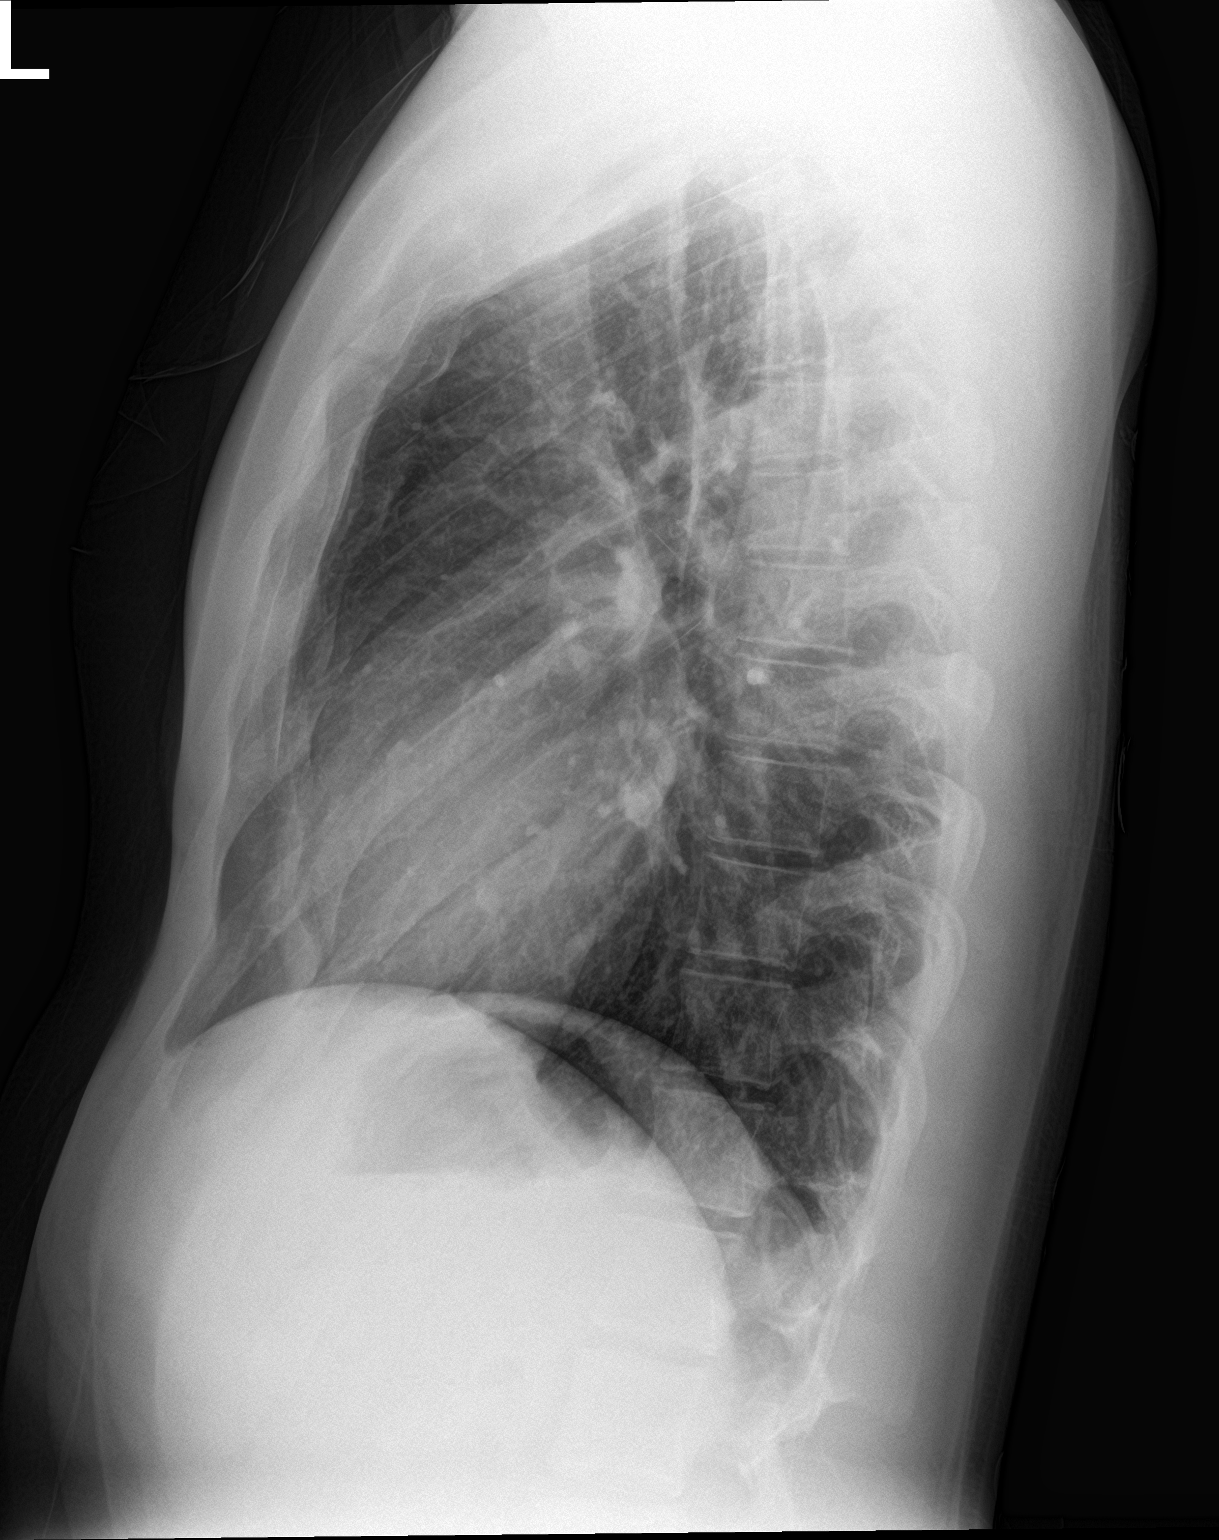

[2 of 2 positions shown; findings below may reference images not displayed]

FINDINGS: The cardiac and mediastinal silhouettes are within normal limits.

The lungs are normally inflated. No airspace consolidation, pleural
effusion, or pulmonary edema is identified. There is no
pneumothorax.

No acute osseous abnormality identified.
IMPRESSION: No radiographic evidence for active cardiopulmonary disease.

## 2019-11-26 ENCOUNTER — Encounter: Payer: Self-pay | Admitting: Adult Health

## 2019-11-26 ENCOUNTER — Ambulatory Visit (INDEPENDENT_AMBULATORY_CARE_PROVIDER_SITE_OTHER): Admitting: Adult Health

## 2019-11-26 ENCOUNTER — Other Ambulatory Visit: Payer: Self-pay

## 2019-11-26 VITALS — BP 125/80 | HR 58 | Temp 97.5°F | Ht 69.0 in | Wt 184.0 lb

## 2019-11-26 DIAGNOSIS — M5441 Lumbago with sciatica, right side: Secondary | ICD-10-CM | POA: Diagnosis not present

## 2019-11-26 DIAGNOSIS — G8929 Other chronic pain: Secondary | ICD-10-CM

## 2019-11-26 DIAGNOSIS — R222 Localized swelling, mass and lump, trunk: Secondary | ICD-10-CM

## 2019-11-26 DIAGNOSIS — M5442 Lumbago with sciatica, left side: Secondary | ICD-10-CM

## 2019-11-26 DIAGNOSIS — R2230 Localized swelling, mass and lump, unspecified upper limb: Secondary | ICD-10-CM

## 2019-11-26 NOTE — Progress Notes (Signed)
[facility] [address] [place], [place] [address]  Internal MEDICINE  Telephone Visit  Patient Name: Evan Leach  [id number]  [id number]  Date of Service: 11/27/2019  I connected with the patient at 353 by telephone and verified the patients identity using two identifiers.   I discussed the limitations, risks, security and privacy concerns of performing an evaluation and management service by telephone and the availability of in person appointments. I also discussed with the patient that there may be a patient responsible charge related to the service.  The patient expressed understanding and agrees to proceed.    Chief Complaint  Patient presents with  . Telephone Assessment    need referral for physical therapy   . Telephone Screen  . Pain    both side under armpit     HPI  Pt seen via video. Pt reports for awhile now he has been having intermittent underarm tenderness. He reports feeling like there is something under the skin.  He denies any Rash, dryness, or redness. He also is reporting low back pain that has been going on for some time.  He started physical therapy but due to covid, he would like a new referral.     Current Medication: Outpatient Encounter Medications as of 11/26/2019  Medication Sig  . [DISCONTINUED] azithromycin (ZITHROMAX) 250 MG tablet Take as directed  . [DISCONTINUED] predniSONE (DELTASONE) 10 MG tablet Use per dose pack   No facility-administered encounter medications on file as of 11/26/2019.    Surgical History: Past Surgical History:  Procedure Laterality Date  . WISDOM TOOTH EXTRACTION      Medical History: History reviewed. No pertinent past medical history.  Family History: Family History  Problem Relation Age of Onset  . Diabetes Maternal Grandmother   . Diabetes Maternal Grandfather   . Diabetes Paternal Grandmother     Social History   Socioeconomic History  . Marital status: Single    Spouse name: Not on file   . Number of children: Not on file  . Years of education: Not on file  . Highest education level: Not on file  Occupational History  . Not on file  Tobacco Use  . Smoking status: Never Smoker  . Smokeless tobacco: Never Used  Substance and Sexual Activity  . Alcohol use: Yes    Comment: social  . Drug use: Never  . Sexual activity: Not on file  Other Topics Concern  . Not on file  Social History Narrative  . Not on file   Social Determinants of Health   Financial Resource Strain:   . Difficulty of Paying Living Expenses: Not on file  Food Insecurity:   . Worried About Programme researcher, broadcasting/film/video in the Last Year: Not on file  . Ran Out of Food in the Last Year: Not on file  Transportation Needs:   . Lack of Transportation (Medical): Not on file  . Lack of Transportation (Non-Medical): Not on file  Physical Activity:   . Days of Exercise per Week: Not on file  . Minutes of Exercise per Session: Not on file  Stress:   . Feeling of Stress : Not on file  Social Connections:   . Frequency of Communication with Friends and Family: Not on file  . Frequency of Social Gatherings with Friends and Family: Not on file  . Attends Religious Services: Not on file  . Active Member of Clubs or Organizations: Not on file  . Attends Banker Meetings: Not  on file  . Marital Status: Not on file  Intimate Partner Violence:   . Fear of Current or Ex-Partner: Not on file  . Emotionally Abused: Not on file  . Physically Abused: Not on file  . Sexually Abused: Not on file      Review of Systems  Constitutional: Negative.  Negative for chills, fatigue and unexpected weight change.  HENT: Negative.  Negative for congestion, rhinorrhea, sneezing and sore throat.   Eyes: Negative for redness.  Respiratory: Negative.  Negative for cough, chest tightness and shortness of breath.   Cardiovascular: Negative.  Negative for chest pain and palpitations.  Gastrointestinal: Negative.  Negative  for abdominal pain, constipation, diarrhea, nausea and vomiting.  Endocrine: Negative.   Genitourinary: Negative.  Negative for dysuria and frequency.  Musculoskeletal: Negative.  Negative for arthralgias, back pain, joint swelling and neck pain.  Skin: Negative.  Negative for rash.  Allergic/Immunologic: Negative.   Neurological: Negative.  Negative for tremors and numbness.  Hematological: Negative for adenopathy. Does not bruise/bleed easily.  Psychiatric/Behavioral: Negative.  Negative for behavioral problems, sleep disturbance and suicidal ideas. The patient is not nervous/anxious.     Vital Signs: BP 125/80   Pulse (!) 58   Temp (!) 97.5 F (36.4 C)   Ht 5\' 9"  (1.753 m)   Wt 184 lb (83.5 kg)   SpO2 98%   BMI 27.17 kg/m    Observation/Objective:  Well appearing, NAD noted.    Assessment/Plan: 1. Chronic bilateral low back pain with bilateral sciatica Have physical therapy evaluate and treat. - Ambulatory referral to Physical Therapy  2. Axillary fullness Change deodorant, and do not wear shirts that are tight in axilla.  It continued issues return to clinic.  3. Labs ordered for physical - CBC with Differential/Platelet - Lipid Panel With LDL/HDL Ratio - TSH - T4, free - Comprehensive metabolic panel   General Counseling: dewaine morocho understanding of the findings of today's phone visit and agrees with plan of treatment. I have discussed any further diagnostic evaluation that may be needed or ordered today. We also reviewed his medications today. he has been encouraged to call the office with any questions or concerns that should arise related to todays visit.    Orders Placed This Encounter  Procedures  . CBC with Differential/Platelet  . Lipid Panel With LDL/HDL Ratio  . TSH  . T4, free  . Comprehensive metabolic panel  . Ambulatory referral to Physical Therapy    No orders of the defined types were placed in this encounter.   Time spent: [facility] Internal medicine

## 2019-12-02 ENCOUNTER — Ambulatory Visit

## 2019-12-03 ENCOUNTER — Other Ambulatory Visit: Payer: Self-pay

## 2019-12-03 ENCOUNTER — Ambulatory Visit: Attending: Adult Health

## 2019-12-03 DIAGNOSIS — M6281 Muscle weakness (generalized): Secondary | ICD-10-CM

## 2019-12-03 DIAGNOSIS — M546 Pain in thoracic spine: Secondary | ICD-10-CM | POA: Diagnosis present

## 2019-12-03 DIAGNOSIS — G8929 Other chronic pain: Secondary | ICD-10-CM

## 2019-12-03 DIAGNOSIS — M545 Low back pain: Secondary | ICD-10-CM | POA: Insufficient documentation

## 2019-12-03 NOTE — Therapy (Signed)
[facility] [address], [place], [address] Phone: [phone]   Fax:  [phone]  Physical Therapy Evaluation  Patient Details  Name: Evan Leach MRN: [id number] Date of Birth: November 02, 2000 Referring Provider (PT): Orson Gear, NP   Encounter Date: 12/03/2019  PT End of Session - 12/03/19 1603    Visit Number  1    Number of Visits  17    Date for PT Re-Evaluation  01/29/20    PT Start Time  0867    PT Stop Time  1702    PT Time Calculation (min)  57 min    Activity Tolerance  Patient tolerated treatment well    Behavior During Therapy  [facility] for tasks assessed/performed       No past medical history on file.  Past Surgical History:  Procedure Laterality Date  . WISDOM TOOTH EXTRACTION      There were no vitals filed for this visit.   Subjective Assessment - 12/03/19 1606    Subjective  low back: 4/10 currenlty (pt sitting), 6/10 at most for the past 3 months. Mid thoracic: 0/10 currently, 7/10 at most for the past 3 months. L hip: 0/10 currently, 6/10 at most for the past 3 months.    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 Pain began 2 years ago, gradual onset. Pain began in low back which can pop back into place which feels better (usually when walking). Pain has improved since COVID started since pt stopped running 20 miles a week. Currently feels pain between his shoulder blades which began October 2020, while deadlifting (135 lbs). Pt said that he heard cavitation in his thoracic spine at shoulder blade area.  Pt also adds that he feels like a sharp pain at his L hip while walking. Pt popped his hip and it feels better.  Pt used to have B LE pain (both knees, shins, and feet) which happens now and again when he used to run. No longer has pain when pt stopped running.    Patient Stated Goals  Reduce the thoracic pain. Have ways to alleviate the pain when it occurs.    Currently in  Pain?  Yes    Pain Score  4     Pain Location  Back    Pain Orientation  Right;Left;Posterior;Lateral;Upper    Pain Type  Chronic pain    Pain Onset  More than a month ago    Pain Frequency  Occasional    Aggravating Factors   low back: bending over. thoracic spine: twisting while reaching up.    Pain Relieving Factors  low back: Back extension, back extension with rotation; thoracic spine: extension         [facility] PT Assessment - 12/03/19 1624      Assessment   Medical Diagnosis  Chronic bilateral low back pain with bilateral sciatica    Referring Provider (PT)  Orson Gear, NP    Onset Date/Surgical Date  11/26/19   Date PT referral signed   Prior Therapy  No known PT for current condition      Precautions   Precaution Comments  none      Restrictions   Other Position/Activity Restrictions  no known restrictions      Prior Function   Vocation  Full time employment   Presenter, broadcasting Requirements  PLOF: less difficulty performing tasks which involve twisting as well as bending over.  Able to jog 20 miles  comfortably      Observation/Other Assessments   Focus on Therapeutic Outcomes (FOTO)   Lumbar FOTO: 65      Posture/Postural Control   Posture Comments  Protracted neck, B protracted shoulders, R shoulder lower, slight R thoracolumbar convexity, L foot pronation. Decreased lordosis      AROM   Cervical Flexion  Full    Cervical Extension  full    Cervical - Right Side Bend  WFL    Cervical - Left Side Bend  WFL    Cervical - Right Rotation  full    Cervical - Left Rotation  full    Lumbar Flexion  full with low back pressure (same area of pain)    Lumbar Extension  WFL, decreased thoracic extension    low back pressure with PT overpressure   Lumbar - Right Side Bend  WFL   L low back pressure with PT overpressure   Lumbar - Left Side Bend  WFL    Lumbar - Right Rotation  WFL with reproduction of upper thoracic pain    Lumbar - Left Rotation  WFL       PROM   Overall PROM Comments  Hip in 90/90: IR 30 degrees R; 40 degrees L      Strength   Right Hip Flexion  4/5    Right Hip Extension  4/5    Right Hip ABduction  4/5    Left Hip Flexion  4/5    Left Hip Extension  4-/5    Left Hip ABduction  4+/5    Right Knee Flexion  5/5    Right Knee Extension  4+/5    Left Knee Flexion  5/5    Left Knee Extension  4+/5    Right Ankle Dorsiflexion  4+/5    Right Ankle Plantar Flexion  4+/5   Manually resisted in sitting   Left Ankle Dorsiflexion  4/5    Left Ankle Plantar Flexion  4+/5   Manually resisted in sitting     Palpation   Palpation comment  Slight decreased mobilility with R UPA to L5-T1 TP. Decreased CPA to mid thoracic spine.       Special Tests   Other special tests  (+) Long sit test suggesting anterior nutation of L innominate.       Ambulation/Gait   Gait Comments  R pelvic drop during L LE stance phase, decreased trunk rotation                Objective measurements completed on examination: See above findings.   No latex allergies X-ray shows mild case of scoliosis per pt.     Try prone on elbows next visit if appropriate.   Low back extension preference  Patient is a 19 year old male who came to physical therapy secondary to back pain. He currently presents with mid thoracic and low back pain, decreased mid thoracic mobility, bilateral hip weakness, positive special test suggesting lumbopelvic involvement, reproduction of symptoms with lumbar flexion as well as R thoracic rotation, and difficulty performing functional tasks which involve bending over as well as with twisting. Pt will benefit from skilled physical therapy services to address the aforementioned deficits.           PT Education - 12/03/19 1825    Education Details  plan of care    Person(s) Educated  Patient    Methods  Explanation    Comprehension  Verbalized understanding  PT Short Term Goals - 12/03/19 1814      PT  SHORT TERM GOAL #1   Title  Patient will be independent with his HEP to decrease pain, improve strength and function.    Time  3    Period  Weeks    Status  New    Target Date  12/26/19        PT Long Term Goals - 12/03/19 1814      PT LONG TERM GOAL #1   Title  Patient will have a decrease in low back pain to 2/10 or less at worst to promote ability to perform tasks which involve bending over more comfortably.    Baseline  6/10 back pain at most for the past 3 months (12/03/2019)    Time  8    Period  Weeks    Status  New    Target Date  01/29/20      PT LONG TERM GOAL #2   Title  Patient will have a decrease in thoracic pain to 3/10 or less at worst to promote ability to perform movements which involve twisting more comfortably.    Baseline  7/10 thoracic pain at most for the past 3 months (12/03/2019)    Time  8    Period  Weeks    Status  New    Target Date  01/29/20      PT LONG TERM GOAL #3   Title  Patient will improve bilateral hip abduction and extension strength by at least 1/2 MMT grade to promote ability to perform functional tasks more comfortably for his back.    Time  8    Period  Weeks    Status  New    Target Date  01/29/20      PT LONG TERM GOAL #4   Title  Patient will improve his lumbar spine FOTO score by at least 10 points as a demonstration of improved function.    Baseline  Lumbar spine FOTO: 65 (12/03/2019)    Time  8    Period  Weeks    Status  New    Target Date  01/29/20             Plan - 12/03/19 1810    Clinical Impression Statement  Patient is a 19 year old male who came to physical therapy secondary to back pain. He currently presents with mid thoracic and low back pain, decreased mid thoracic mobility, bilateral hip weakness, positive special test suggesting lumbopelvic involvement, reproduction of symptoms with lumbar flexion as well as R thoracic rotation, and difficulty performing functional tasks which involve bending over as well as  with twisting. Pt will benefit from skilled physical therapy services to address the aforementioned deficits.    Personal Factors and Comorbidities  Past/Current Experience;Profession;Time since onset of injury/illness/exacerbation    Examination-Activity Limitations  Bend    Stability/Clinical Decision Making  Stable/Uncomplicated    Clinical Decision Making  Low    Rehab Potential  Good    PT Frequency  2x / week    PT Duration  8 weeks    PT Treatment/Interventions  Neuromuscular re-education;Therapeutic activities;Therapeutic exercise;Patient/family education;Manual techniques;Dry needling;Spinal Manipulations;Joint Manipulations;Aquatic Therapy;Electrical Stimulation;Iontophoresis 4mg /ml Dexamethasone;Traction;Ultrasound    PT Next Visit Plan  trunk mobility, scapular, trunk, hip strengthening, lumbopelvic control, manual techniques, modalities PRN    Consulted and Agree with Plan of Care  Patient       Patient will benefit from skilled therapeutic intervention in order  to improve the following deficits and impairments:  Pain, Postural dysfunction, Improper body mechanics, Decreased strength  Visit Diagnosis: Chronic bilateral low back pain, unspecified whether sciatica present - Plan: PT plan of care cert/re-cert  Pain in thoracic spine - Plan: PT plan of care cert/re-cert  Muscle weakness (generalized) - Plan: PT plan of care cert/re-cert     Problem List There are no problems to display for this patient.   Loralyn Freshwater PT, DPT   12/03/2019, 6:28 PM  [facility] PH[facility] AND SPORTS MEDICINE [address], [place], [address] Phone: [phone]   Fax:  [phone]  Name: Nathanyl Andujo MRN: [id number] Date of Birth: November 26, 2000

## 2019-12-09 ENCOUNTER — Ambulatory Visit

## 2019-12-09 ENCOUNTER — Other Ambulatory Visit: Payer: Self-pay

## 2019-12-09 DIAGNOSIS — M545 Low back pain, unspecified: Secondary | ICD-10-CM

## 2019-12-09 DIAGNOSIS — M6281 Muscle weakness (generalized): Secondary | ICD-10-CM

## 2019-12-09 DIAGNOSIS — M546 Pain in thoracic spine: Secondary | ICD-10-CM

## 2019-12-09 DIAGNOSIS — G8929 Other chronic pain: Secondary | ICD-10-CM

## 2019-12-09 NOTE — Patient Instructions (Signed)
Access Code: [email or web address]  URL: [email or web address]  Date: 12/09/2019  Prepared by: Loralyn Freshwater   Exercises Standing Lumbar Extension - 10 reps - 3 sets - 5 seconds hold - 1x daily - 7x weekly Bent Knee Fallouts - 10 reps - 3 sets - 3x daily - 7x weekly

## 2019-12-09 NOTE — Therapy (Signed)
[place] [facility]OR[facility]IC[facility] [address], [place], [address] Phone: [phone]   Fax:  [phone]  Physical Therapy Treatment  Patient Details  Name: Bakari Nikolai MRN: [id number] Date of Birth: 10-16-2001 Referring Provider (PT): Orson Gear, NP   Encounter Date: 12/09/2019  PT End of Session - 12/09/19 1417    Visit Number  2    Number of Visits  17    Date for PT Re-Evaluation  01/29/20    PT Start Time  8546    PT Stop Time  1505    PT Time Calculation (min)  48 min    Activity Tolerance  Patient tolerated treatment well    Behavior During Therapy  [facility] for tasks assessed/performed       No past medical history on file.  Past Surgical History:  Procedure Laterality Date  . WISDOM TOOTH EXTRACTION      There were no vitals filed for this visit.  Subjective Assessment - 12/09/19 1418    Subjective  Low back feels iffy. Pt got out of the car, put on his back pack, stood up and his R hip started bothering him. 0/10 low back and mid back pain currently. Pt feels popping B shoulder when performing abduction.    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 Pain began 2 years ago, gradual onset. Pain began in low back which can pop back into place which feels better (usually when walking). Pain has improved since COVID started since pt stopped running 20 miles a week. Currently feels pain between his shoulder blades which began October 2020, while deadlifting (135 lbs). Pt said that he heard cavitation in his thoracic spine at shoulder blade area.  Pt also adds that he feels like a sharp pain at his L hip while walking. Pt popped his hip and it feels better.  Pt used to have B LE pain (both knees, shins, and feet) which happens now and again when he used to run. No longer has pain when pt stopped running.    Patient Stated Goals  Reduce the thoracic pain. Have ways to alleviate the pain when it occurs.     Currently in Pain?  No/denies    Pain Score  0-No pain    Pain Onset  More than a month ago                               PT Education - 12/09/19 1713    Education Details  ther-ex, HEP    Person(s) Educated  Patient    Methods  Explanation;Demonstration;Tactile cues;Verbal cues    Comprehension  Returned demonstration;Verbalized understanding     Objectives   Medbridge Access Code: [email or web address]  Going to the field from 12/19/19- 12/27/19 and will not be available for PT    Therapeutic exercises   Standing back extension 10x5 seconds for 2 sets  Standing L lateral shift correction 10x5 seconds   Increased symptoms R low back tighness  standing R lateral shift correction 10x5 seconds for 2 sets  No increase in R low back tighness  Standing back extension to the R 7x5 seconds. R low back pinching sensation  Prone glute max/quad set isometrics 10x5 seconds each LE  Low back extension compensation   (+) Eli test L > R for rectus femoris tightness   Supine transversus abdominis contraction with hip fallouts 5x each LE  L pelvic rotation during L hip fallout observed.    Anterior pelvic tilt observed with sit <> stanbd   Improved exercise technique, movement at target joints, use of target muscles after mod verbal, visual, tactile cues.     Manual therapy  Prone R UPA to L3 grade 3- Reproduced symptoms.   Then L UPA to L 3 grade 3-   Response to treatment Pt tolerated session well without aggravation of symptoms.   Clinical presentation  Pt demonstrates difficulty with lumbopelvic strength and control obaserved with movements such as sit <> stand and prone glute set with low back extension compensation. Worked on improving core muscle strengthening as well as promoting lumbopelvic control to help address. Pt tolerated session well without aggravation of symptoms. Pt will benefit from continued skilled physical therapy services to decrease pain,  improve strength and function.            PT Short Term Goals - 12/03/19 1814      PT SHORT TERM GOAL #1   Title  Patient will be independent with his HEP to decrease pain, improve strength and function.    Time  3    Period  Weeks    Status  New    Target Date  12/26/19        PT Long Term Goals - 12/03/19 1814      PT LONG TERM GOAL #1   Title  Patient will have a decrease in low back pain to 2/10 or less at worst to promote ability to perform tasks which involve bending over more comfortably.    Baseline  6/10 back pain at most for the past 3 months (12/03/2019)    Time  8    Period  Weeks    Status  New    Target Date  01/29/20      PT LONG TERM GOAL #2   Title  Patient will have a decrease in thoracic pain to 3/10 or less at worst to promote ability to perform movements which involve twisting more comfortably.    Baseline  7/10 thoracic pain at most for the past 3 months (12/03/2019)    Time  8    Period  Weeks    Status  New    Target Date  01/29/20      PT LONG TERM GOAL #3   Title  Patient will improve bilateral hip abduction and extension strength by at least 1/2 MMT grade to promote ability to perform functional tasks more comfortably for his back.    Time  8    Period  Weeks    Status  New    Target Date  01/29/20      PT LONG TERM GOAL #4   Title  Patient will improve his lumbar spine FOTO score by at least 10 points as a demonstration of improved function.    Baseline  Lumbar spine FOTO: 65 (12/03/2019)    Time  8    Period  Weeks    Status  New    Target Date  01/29/20            Plan - 12/09/19 1711    Clinical Impression Statement  Pt demonstrates difficulty with lumbopelvic strength and control obaserved with movements such as sit <> stand and prone glute set with low back extension compensation. Worked on improving core muscle strengthening as well as promoting lumbopelvic control to help address. Pt tolerated session well without  aggravation of symptoms. Pt  will benefit from continued skilled physical therapy services to decrease pain, improve strength and function.    Personal Factors and Comorbidities  Past/Current Experience;Profession;Time since onset of injury/illness/exacerbation    Examination-Activity Limitations  Bend    Stability/Clinical Decision Making  Stable/Uncomplicated    Rehab Potential  Good    PT Frequency  2x / week    PT Duration  8 weeks    PT Treatment/Interventions  Neuromuscular re-education;Therapeutic activities;Therapeutic exercise;Patient/family education;Manual techniques;Dry needling;Spinal Manipulations;Joint Manipulations;Aquatic Therapy;Electrical Stimulation;Iontophoresis 4mg /ml Dexamethasone;Traction;Ultrasound    PT Next Visit Plan  trunk mobility, scapular, trunk, hip strengthening, lumbopelvic control, manual techniques, modalities PRN    Consulted and Agree with Plan of Care  Patient       Patient will benefit from skilled therapeutic intervention in order to improve the following deficits and impairments:  Pain, Postural dysfunction, Improper body mechanics, Decreased strength  Visit Diagnosis: Chronic bilateral low back pain, unspecified whether sciatica present  Pain in thoracic spine  Muscle weakness (generalized)     Problem List There are no problems to display for this patient.  PT, DPT   12/09/2019, 5:14 PM  [facility] PHYSICAL AND SPORTS MEDICINE [address], [place] Phone: [phone]   Fax:  [phone]  Name: Zac Torti MRN: Chalmers Cater Date of Birth: 04/11/2001

## 2019-12-11 ENCOUNTER — Other Ambulatory Visit: Payer: Self-pay

## 2019-12-11 ENCOUNTER — Ambulatory Visit

## 2019-12-11 DIAGNOSIS — M545 Low back pain, unspecified: Secondary | ICD-10-CM

## 2019-12-11 DIAGNOSIS — G8929 Other chronic pain: Secondary | ICD-10-CM

## 2019-12-11 DIAGNOSIS — M6281 Muscle weakness (generalized): Secondary | ICD-10-CM

## 2019-12-11 DIAGNOSIS — M546 Pain in thoracic spine: Secondary | ICD-10-CM

## 2019-12-11 NOTE — Therapy (Signed)
[place] [facility] [address] [place], [address] Phone: [phone]   Fax:  [phone]  Physical Therapy Treatment  Patient Details  Name: Evan Leach MRN: [id number] Date of Birth: [birth year] Referring Provider (PT): Orson Gear, NP   Encounter Date: 12/11/2019  PT End of Session - 12/11/19 0817    Visit Number  3    Number of Visits  17    Date for PT Re-Evaluation  01/29/20    PT Start Time  0817    PT Stop Time  0859    PT Time Calculation (min)  42 min    Activity Tolerance  Patient tolerated treatment well    Behavior During Therapy  [facility] for tasks assessed/performed       No past medical history on file.  Past Surgical History:  Procedure Laterality Date  . WISDOM TOOTH EXTRACTION      There were no vitals filed for this visit.  Subjective Assessment - 12/11/19 0818    Subjective  Back is the same. No pain currently. No R or L hip pain currently. Got out of his car yesterday with his bag on his back without pain.    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 Pain began 2 years ago, gradual onset. Pain began in low back which can pop back into place which feels better (usually when walking). Pain has improved since COVID started since pt stopped running 20 miles a week. Currently feels pain between his shoulder blades which began October 2020, while deadlifting (135 lbs). Pt said that he heard cavitation in his thoracic spine at shoulder blade area.  Pt also adds that he feels like a sharp pain at his L hip while walking. Pt popped his hip and it feels better.  Pt used to have B LE pain (both knees, shins, and feet) which happens now and again when he used to run. No longer has pain when pt stopped running.    Patient Stated Goals  Reduce the thoracic pain. Have ways to alleviate the pain when it occurs.    Currently in Pain?  No/denies    Pain Score  0-No pain    Pain Onset  More than  a month ago                               PT Education - 12/11/19 0827    Education Details  ther-ex    Person(s) Educated  Patient    Methods  Explanation;Demonstration;Tactile cues;Verbal cues    Comprehension  Returned demonstration;Verbalized understanding      Objectives  No latex band allergies  Medbridge Access Code: [email or web address]  Going to the field from 12/19/19- 12/27/19 and will not be available for PT    Therapeutic exercises   With transversus abdominis contraction  Supine hip fallouts 10x each LE   hooklying leg extension 10x2 each LE  Bridge with B shoulder extension isometrics   10x3  standing chops to the R double blue band 10x5 seconds.   R anterior shoulder discomfort, eases with rest   Standing B shoulder extension with scapular retraction resisting blue band   10x5 seconds for 3 sets  Pallof press double blue band  R 10x5 seconds   L 10x5 seconds    Add hip hinging next visit if appropriate    Improved exercise technique, movement at target joints, use  of target muscles after mod verbal, visual, tactile cues.      Response to treatment Pt tolerated session well without aggravation of symptoms.   Clinical presentation Continued working on improving core strength and lumbopelvic control to promote better mechanics at his low back. Good muscle workout observed. Lumbar extension compensation observed with bridge in which pt was cued for posterior pelvic tilt to address. Pt tolerated session well without complain of pain. Pt will benefit from continued skilled physical therapy services to decrease pain, improve strength, and function.     PT Short Term Goals - 12/03/19 1814      PT SHORT TERM GOAL #1   Title  Patient will be independent with his HEP to decrease pain, improve strength and function.    Time  3    Period  Weeks    Status  New    Target Date  12/26/19        PT Long Term Goals -  12/03/19 1814      PT LONG TERM GOAL #1   Title  Patient will have a decrease in low back pain to 2/10 or less at worst to promote ability to perform tasks which involve bending over more comfortably.    Baseline  6/10 back pain at most for the past 3 months (12/03/2019)    Time  8    Period  Weeks    Status  New    Target Date  01/29/20      PT LONG TERM GOAL #2   Title  Patient will have a decrease in thoracic pain to 3/10 or less at worst to promote ability to perform movements which involve twisting more comfortably.    Baseline  7/10 thoracic pain at most for the past 3 months (12/03/2019)    Time  8    Period  Weeks    Status  New    Target Date  01/29/20      PT LONG TERM GOAL #3   Title  Patient will improve bilateral hip abduction and extension strength by at least 1/2 MMT grade to promote ability to perform functional tasks more comfortably for his back.    Time  8    Period  Weeks    Status  New    Target Date  01/29/20      PT LONG TERM GOAL #4   Title  Patient will improve his lumbar spine FOTO score by at least 10 points as a demonstration of improved function.    Baseline  Lumbar spine FOTO: 65 (12/03/2019)    Time  8    Period  Weeks    Status  New    Target Date  01/29/20            Plan - 12/11/19 0816    Clinical Impression Statement  Continued working on improving core strength and lumbopelvic control to promote better mechanics at his low back. Good muscle workout observed. Lumbar extension compensation observed with bridge in which pt was cued for posterior pelvic tilt to address. Pt tolerated session well without complain of pain. Pt will benefit from continued skilled physical therapy services to decrease pain, improve strength, and function.    Personal Factors and Comorbidities  Past/Current Experience;Profession;Time since onset of injury/illness/exacerbation    Examination-Activity Limitations  Bend    Stability/Clinical Decision Making   Stable/Uncomplicated    Rehab Potential  Good    PT Frequency  2x / week    PT  Duration  8 weeks    PT Treatment/Interventions  Neuromuscular re-education;Therapeutic activities;Therapeutic exercise;Patient/family education;Manual techniques;Dry needling;Spinal Manipulations;Joint Manipulations;Aquatic Therapy;Electrical Stimulation;Iontophoresis 4mg /ml Dexamethasone;Traction;Ultrasound    PT Next Visit Plan  trunk mobility, scapular, trunk, hip strengthening, lumbopelvic control, manual techniques, modalities PRN    Consulted and Agree with Plan of Care  Patient       Patient will benefit from skilled therapeutic intervention in order to improve the following deficits and impairments:  Pain, Postural dysfunction, Improper body mechanics, Decreased strength  Visit Diagnosis: Chronic bilateral low back pain, unspecified whether sciatica present  Pain in thoracic spine  Muscle weakness (generalized)     Problem List There are no problems to display for this patient.   PT, DPT   12/11/2019, 12:15 PM  [facility] [address], [place] Phone: [phone]   Fax:  [phone]  Name: Evan Leach MRN: Chalmers Cater Date of Birth: Feb 14, 2001

## 2019-12-16 ENCOUNTER — Ambulatory Visit

## 2019-12-16 ENCOUNTER — Other Ambulatory Visit: Payer: Self-pay

## 2019-12-16 DIAGNOSIS — G8929 Other chronic pain: Secondary | ICD-10-CM

## 2019-12-16 DIAGNOSIS — M545 Low back pain, unspecified: Secondary | ICD-10-CM

## 2019-12-16 DIAGNOSIS — M546 Pain in thoracic spine: Secondary | ICD-10-CM

## 2019-12-16 DIAGNOSIS — M6281 Muscle weakness (generalized): Secondary | ICD-10-CM

## 2019-12-16 NOTE — Patient Instructions (Addendum)
Access Code: [email or web address]  URL: [email or web address]  Date: 12/16/2019  Prepared by: Loralyn Freshwater   Exercises Standing Lumbar Extension - 10 reps - 3 sets - 5 seconds hold - 1x daily - 7x weekly Bent Knee Fallouts - 10 reps - 3 sets - 3x daily - 7x weekly Supine Bridge - 10 reps - 3 sets - 1x daily - 7x weekly Shoulder Extension with Resistance - 10 reps - 3 sets - 5 seconds hold - 1x daily - 7x weekly Standing Quadriceps Stretch - 3 reps - 1 sets - 30 seconds hold - 3x daily - 7x weekly Seated Hip Hinge with Dowel - 10 reps - 3 sets - 1x daily - 7x weekly

## 2019-12-16 NOTE — Therapy (Signed)
[facility] [address] [place], [address] Phone: [phone]   Fax:  [phone]  Physical Therapy Treatment  Patient Details  Name: Evan Leach MRN: [id number] Date of Birth: 2001/01/29 Referring Provider (PT): Blima Ledger, NP   Encounter Date: 12/16/2019  PT End of Session - 12/16/19 1602    Visit Number  4    Number of Visits  17    Date for PT Re-Evaluation  01/29/20    PT Start Time  1602    PT Stop Time  1644    PT Time Calculation (min)  42 min    Activity Tolerance  Patient tolerated treatment well    Behavior During Therapy  [facility] for tasks assessed/performed       No past medical history on file.  Past Surgical History:  Procedure Laterality Date  . WISDOM TOOTH EXTRACTION      There were no vitals filed for this visit.  Subjective Assessment - 12/16/19 1603    Subjective  Back is doing really good. No pain or tired feeling either. Back was really good after last session.    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 Pain began 2 years ago, gradual onset. Pain began in low back which can pop back into place which feels better (usually when walking). Pain has improved since COVID started since pt stopped running 20 miles a week. Currently feels pain between his shoulder blades which began October 2020, while deadlifting (135 lbs). Pt said that he heard cavitation in his thoracic spine at shoulder blade area.  Pt also adds that he feels like a sharp pain at his L hip while walking. Pt popped his hip and it feels better.  Pt used to have B LE pain (both knees, shins, and feet) which happens now and again when he used to run. No longer has pain when pt stopped running.    Patient Stated Goals  Reduce the thoracic pain. Have ways to alleviate the pain when it occurs.    Currently in Pain?  No/denies    Pain Score  0-No pain    Pain Onset  More than a month ago                                PT Education - 12/16/19 1614    Education Details  ther-ex, HEP    Person(s) Educated  Patient    Methods  Explanation;Demonstration;Tactile cues;Verbal cues;Handout    Comprehension  Returned demonstration;Verbalized understanding      Objectives  No latex band allergies  MedbridgeAccess Code: [email or web address]  Going to the field from 12/19/19- 12/27/19 and will not be available for PT    Therapeutic exercises   Standing quad stretch   L 30 seconds x 3  R 30 seconds x 3  Transversus abdominis contraction with:  Seated hip hinging 10x2   Then with sit <> stand 10x2   Planks 30 seconds x 3  Pallof press double blue band             R 10x5 seconds for 2 sets             L 10x5 seconds for 2 sets  Static squat with B shoulder extension Blue band 10x5 seconds with emphasis on neutral back  Ergonomic lifting 40 lbs kettle bell 10x    Improved exercise technique, movement  at target joints, use of target muscles after mod verbal, visual, tactile cues.     Response to treatment Pt tolerated session well without aggravation of symptoms.   Clinical presentation  Improved lumbar control observed. Continued working on improving core strength as well as lumbopelvic control to help decrease stress to low back. Improved low back comfort level based on pt subjective reports. Pt tolerated session well without aggravation of symptoms. Good muscle use felt with exercises. Pt will benefit from continued skilled physical therapy services to decrease pain, improve strength and function.       PT Short Term Goals - 12/03/19 1814      PT SHORT TERM GOAL #1   Title  Patient will be independent with his HEP to decrease pain, improve strength and function.    Time  3    Period  Weeks    Status  New    Target Date  12/26/19        PT Long Term Goals - 12/03/19 1814      PT LONG TERM GOAL #1   Title  Patient will have  a decrease in low back pain to 2/10 or less at worst to promote ability to perform tasks which involve bending over more comfortably.    Baseline  6/10 back pain at most for the past 3 months (12/03/2019)    Time  8    Period  Weeks    Status  New    Target Date  01/29/20      PT LONG TERM GOAL #2   Title  Patient will have a decrease in thoracic pain to 3/10 or less at worst to promote ability to perform movements which involve twisting more comfortably.    Baseline  7/10 thoracic pain at most for the past 3 months (12/03/2019)    Time  8    Period  Weeks    Status  New    Target Date  01/29/20      PT LONG TERM GOAL #3   Title  Patient will improve bilateral hip abduction and extension strength by at least 1/2 MMT grade to promote ability to perform functional tasks more comfortably for his back.    Time  8    Period  Weeks    Status  New    Target Date  01/29/20      PT LONG TERM GOAL #4   Title  Patient will improve his lumbar spine FOTO score by at least 10 points as a demonstration of improved function.    Baseline  Lumbar spine FOTO: 65 (12/03/2019)    Time  8    Period  Weeks    Status  New    Target Date  01/29/20            Plan - 12/16/19 1614    Clinical Impression Statement  Improved lumbar control observed. Continued working on improving core strength as well as lumbopelvic control to help decrease stress to low back. Improved low back comfort level based on pt subjective reports. Pt tolerated session well without aggravation of symptoms. Good muscle use felt with exercises. Pt will benefit from continued skilled physical therapy services to decrease pain, improve strength and function.    Personal Factors and Comorbidities  Past/Current Experience;Profession;Time since onset of injury/illness/exacerbation    Examination-Activity Limitations  Bend    Stability/Clinical Decision Making  Stable/Uncomplicated    Rehab Potential  Good    PT Frequency  2x /  week    PT  Duration  8 weeks    PT Treatment/Interventions  Neuromuscular re-education;Therapeutic activities;Therapeutic exercise;Patient/family education;Manual techniques;Dry needling;Spinal Manipulations;Joint Manipulations;Aquatic Therapy;Electrical Stimulation;Iontophoresis 4mg /ml Dexamethasone;Traction;Ultrasound    PT Next Visit Plan  trunk mobility, scapular, trunk, hip strengthening, lumbopelvic control, manual techniques, modalities PRN    Consulted and Agree with Plan of Care  Patient       Patient will benefit from skilled therapeutic intervention in order to improve the following deficits and impairments:  Pain, Postural dysfunction, Improper body mechanics, Decreased strength  Visit Diagnosis: Chronic bilateral low back pain, unspecified whether sciatica present  Pain in thoracic spine  Muscle weakness (generalized)     Problem List There are no problems to display for this patient.   PT, DPT   12/16/2019, 4:58 PM  [facility] [address], [place] Phone: [phone]   Fax:  [phone]  Name: Evan Leach MRN: Chalmers Cater Date of Birth: 2001/01/19

## 2019-12-18 ENCOUNTER — Ambulatory Visit

## 2019-12-22 ENCOUNTER — Encounter

## 2019-12-24 ENCOUNTER — Encounter

## 2020-01-06 ENCOUNTER — Telehealth: Payer: Self-pay

## 2020-01-06 NOTE — Telephone Encounter (Signed)
Confirmed appointment on 01/08/2020 and screened for covid. klh 

## 2020-01-08 ENCOUNTER — Ambulatory Visit (INDEPENDENT_AMBULATORY_CARE_PROVIDER_SITE_OTHER): Admitting: Adult Health

## 2020-01-08 ENCOUNTER — Encounter: Payer: Self-pay | Admitting: Adult Health

## 2020-01-08 ENCOUNTER — Other Ambulatory Visit: Payer: Self-pay

## 2020-01-08 VITALS — BP 123/86 | HR 71 | Temp 97.6°F | Resp 16 | Ht 69.0 in | Wt 192.0 lb

## 2020-01-08 DIAGNOSIS — R222 Localized swelling, mass and lump, trunk: Secondary | ICD-10-CM | POA: Diagnosis not present

## 2020-01-08 DIAGNOSIS — R2230 Localized swelling, mass and lump, unspecified upper limb: Secondary | ICD-10-CM

## 2020-01-08 DIAGNOSIS — M5441 Lumbago with sciatica, right side: Secondary | ICD-10-CM

## 2020-01-08 DIAGNOSIS — M5442 Lumbago with sciatica, left side: Secondary | ICD-10-CM

## 2020-01-08 DIAGNOSIS — E785 Hyperlipidemia, unspecified: Secondary | ICD-10-CM

## 2020-01-08 DIAGNOSIS — Z0001 Encounter for general adult medical examination with abnormal findings: Secondary | ICD-10-CM

## 2020-01-08 DIAGNOSIS — R300 Dysuria: Secondary | ICD-10-CM

## 2020-01-08 DIAGNOSIS — G8929 Other chronic pain: Secondary | ICD-10-CM

## 2020-01-08 NOTE — Progress Notes (Signed)
[facility] [place], [place] [address]  Internal MEDICINE  Office Visit Note  Patient Name: Evan Leach  [id number]  [id number]  Date of Service: 01/08/2020  Chief Complaint  Patient presents with  . Annual Exam     HPI Pt is here for routine health maintenance examination.  He is a 19 yo male.  He remains in the [employer].  He has bought a home with his fiance.  They are planning to get married soon. He does have a history significant for HLD and chronic back pain.  He is currently in physical therapy for his back.  He has not had his labs drawn yet, he will have those done before next visit.        Current Medication: No outpatient encounter medications on file as of 01/08/2020.   No facility-administered encounter medications on file as of 01/08/2020.    Surgical History: Past Surgical History:  Procedure Laterality Date  . WISDOM TOOTH EXTRACTION      Medical History: History reviewed. No pertinent past medical history.  Family History: Family History  Problem Relation Age of Onset  . Diabetes Maternal Grandmother   . Diabetes Maternal Grandfather   . Diabetes Paternal Grandmother       Review of Systems  Constitutional: Negative.  Negative for chills, fatigue and unexpected weight change.  HENT: Negative.  Negative for congestion, rhinorrhea, sneezing and sore throat.   Eyes: Negative for redness.  Respiratory: Negative.  Negative for cough, chest tightness and shortness of breath.   Cardiovascular: Negative.  Negative for chest pain and palpitations.  Gastrointestinal: Negative.  Negative for abdominal pain, constipation, diarrhea, nausea and vomiting.  Endocrine: Negative.   Genitourinary: Negative.  Negative for dysuria and frequency.  Musculoskeletal: Negative.  Negative for arthralgias, back pain, joint swelling and neck pain.  Skin: Negative.  Negative for rash.  Allergic/Immunologic: Negative.   Neurological: Negative.   Negative for tremors and numbness.  Hematological: Negative for adenopathy. Does not bruise/bleed easily.  Psychiatric/Behavioral: Negative.  Negative for behavioral problems, sleep disturbance and suicidal ideas. The patient is not nervous/anxious.      Vital Signs: BP 123/86   Pulse 71   Temp 97.6 F (36.4 C)   Resp 16   Ht 5\' 9"  (1.753 m)   Wt 192 lb (87.1 kg)   SpO2 98%   BMI 28.35 kg/m    Physical Exam Vitals and nursing note reviewed.  Constitutional:      General: He is not in acute distress.    Appearance: He is well-developed. He is not diaphoretic.  HENT:     Head: Normocephalic and atraumatic.     Mouth/Throat:     Pharynx: No oropharyngeal exudate.  Eyes:     Pupils: Pupils are equal, round, and reactive to light.  Neck:     Thyroid: No thyromegaly.     Vascular: No JVD.     Trachea: No tracheal deviation.  Cardiovascular:     Rate and Rhythm: Normal rate and regular rhythm.     Heart sounds: Normal heart sounds. No murmur. No friction rub. No gallop.   Pulmonary:     Effort: Pulmonary effort is normal. No respiratory distress.     Breath sounds: Normal breath sounds. No wheezing or rales.  Chest:     Chest wall: No tenderness.  Abdominal:     Palpations: Abdomen is soft.     Tenderness: There is no abdominal tenderness. There is no guarding.  Hernia: There is no hernia in the left inguinal area or right inguinal area.  Genitourinary:    Penis: Uncircumcised. No phimosis, paraphimosis, hypospadias or erythema.      Testes: Normal. Cremasteric reflex is present.        Right: Mass, tenderness or swelling not present.        Left: Mass, tenderness or swelling not present.     Epididymis:     Right: Normal.     Left: Normal.  Musculoskeletal:        General: Normal range of motion.     Cervical back: Normal range of motion and neck supple.  Lymphadenopathy:     Cervical: No cervical adenopathy.     Lower Body: No right inguinal adenopathy. No  left inguinal adenopathy.  Skin:    General: Skin is warm and dry.  Neurological:     Mental Status: He is alert and oriented to person, place, and time.     Cranial Nerves: No cranial nerve deficit.  Psychiatric:        Behavior: Behavior normal.        Thought Content: Thought content normal.        Judgment: Judgment normal.      LABS: No results found for this or any previous visit (from the past 2160 hour(s)).    Assessment/Plan: 1. Encounter for general adult medical examination with abnormal findings Up to date on PHM. He will have labs drawn.   2. Chronic bilateral low back pain with bilateral sciatica Continues to have some pain.  Continue with physical therapy.  3. Hyperlipidemia, unspecified hyperlipidemia type Get lipid panel and follow up .   4. Axillary fullness Resolved as of now.  He has not had a single episode since last visit. Continue to monitor  5. Dysuria - UA/M w/rflx Culture, Routine  General Counseling: Evan Leach understanding of the findings of todays visit and agrees with plan of treatment. I have discussed any further diagnostic evaluation that may be needed or ordered today. We also reviewed his medications today. he has been encouraged to call the office with any questions or concerns that should arise related to todays visit.   Orders Placed This Encounter  Procedures  . UA/M w/rflx Culture, Routine    No orders of the defined types were placed in this encounter.   Time spent: 30 Minutes   This patient was seen by Blima Ledger AGNP-C in Collaboration with Dr Lyndon Code as a part of collaborative care agreement    Evan Leach AGNP-C Internal Medicine

## 2020-01-09 LAB — UA/M W/RFLX CULTURE, ROUTINE
Bilirubin, UA: NEGATIVE
Glucose, UA: NEGATIVE
Leukocytes,UA: NEGATIVE
Nitrite, UA: NEGATIVE
Protein,UA: NEGATIVE
RBC, UA: NEGATIVE
Specific Gravity, UA: 1.025 (ref 1.005–1.030)
Urobilinogen, Ur: 0.2 mg/dL (ref 0.2–1.0)
pH, UA: 5 (ref 5.0–7.5)

## 2020-01-09 LAB — MICROSCOPIC EXAMINATION
Bacteria, UA: NONE SEEN
Casts: NONE SEEN /lpf
Epithelial Cells (non renal): NONE SEEN /hpf (ref 0–10)
RBC, Urine: NONE SEEN /hpf (ref 0–2)
WBC, UA: NONE SEEN /hpf (ref 0–5)

## 2020-01-27 ENCOUNTER — Telehealth: Payer: Self-pay

## 2020-01-27 NOTE — Telephone Encounter (Signed)
Called lmom/no vm informing patient of appointment on 01/29/2020. klh

## 2020-01-29 ENCOUNTER — Ambulatory Visit: Admitting: Adult Health

## 2020-02-24 ENCOUNTER — Telehealth: Payer: Self-pay

## 2020-02-24 NOTE — Telephone Encounter (Signed)
Confirmed appointment on 02/26/2020 and screened for covid. klh 

## 2020-02-26 ENCOUNTER — Other Ambulatory Visit: Payer: Self-pay

## 2020-02-26 ENCOUNTER — Encounter: Payer: Self-pay | Admitting: Adult Health

## 2020-02-26 ENCOUNTER — Ambulatory Visit (INDEPENDENT_AMBULATORY_CARE_PROVIDER_SITE_OTHER): Admitting: Adult Health

## 2020-02-26 VITALS — BP 118/69 | HR 62 | Temp 97.5°F | Resp 16 | Ht 69.0 in | Wt 196.6 lb

## 2020-02-26 DIAGNOSIS — M5442 Lumbago with sciatica, left side: Secondary | ICD-10-CM

## 2020-02-26 DIAGNOSIS — G8929 Other chronic pain: Secondary | ICD-10-CM

## 2020-02-26 DIAGNOSIS — M25512 Pain in left shoulder: Secondary | ICD-10-CM

## 2020-02-26 DIAGNOSIS — M25511 Pain in right shoulder: Secondary | ICD-10-CM | POA: Diagnosis not present

## 2020-02-26 DIAGNOSIS — M24819 Other specific joint derangements of unspecified shoulder, not elsewhere classified: Secondary | ICD-10-CM

## 2020-02-26 DIAGNOSIS — M5441 Lumbago with sciatica, right side: Secondary | ICD-10-CM

## 2020-02-26 NOTE — Progress Notes (Signed)
[facility] [place], [place] [address]  Internal MEDICINE  Office Visit Note  Patient Name: Evan Leach  [id number]  [id number]  Date of Service: 02/26/2020  Chief Complaint  Patient presents with  . Acute Visit    bilateral shoulder popping when working out     HPI Pt is here for a sick visit.  He reports bilateral shoulder pain that is mild.  He hears crepitus and popping sounds with movement.  He reports it is worse with pushing or pulling.      Current Medication:  No outpatient encounter medications on file as of 02/26/2020.   No facility-administered encounter medications on file as of 02/26/2020.      Medical History: History reviewed. No pertinent past medical history.   Vital Signs: BP 118/69   Pulse 62   Temp (!) 97.5 F (36.4 C)   Resp 16   Ht 5\' 9"  (1.753 m)   Wt 196 lb 9.6 oz (89.2 kg)   SpO2 97%   BMI 29.03 kg/m    Review of Systems  Constitutional: Negative.  Negative for chills, fatigue and unexpected weight change.  HENT: Negative.  Negative for congestion, rhinorrhea, sneezing and sore throat.   Eyes: Negative for redness.  Respiratory: Negative.  Negative for cough, chest tightness and shortness of breath.   Cardiovascular: Negative.  Negative for chest pain and palpitations.  Gastrointestinal: Negative.  Negative for abdominal pain, constipation, diarrhea, nausea and vomiting.  Endocrine: Negative.   Genitourinary: Negative.  Negative for dysuria and frequency.  Musculoskeletal: Negative.  Negative for arthralgias, back pain, joint swelling and neck pain.  Skin: Negative.  Negative for rash.  Allergic/Immunologic: Negative.   Neurological: Negative.  Negative for tremors and numbness.  Hematological: Negative for adenopathy. Does not bruise/bleed easily.  Psychiatric/Behavioral: Negative.  Negative for behavioral problems, sleep disturbance and suicidal ideas. The patient is not nervous/anxious.     Physical  Exam Vitals and nursing note reviewed.  Constitutional:      General: He is not in acute distress.    Appearance: He is well-developed. He is not diaphoretic.  HENT:     Head: Normocephalic and atraumatic.     Mouth/Throat:     Pharynx: No oropharyngeal exudate.  Eyes:     Pupils: Pupils are equal, round, and reactive to light.  Neck:     Thyroid: No thyromegaly.     Vascular: No JVD.     Trachea: No tracheal deviation.  Cardiovascular:     Rate and Rhythm: Normal rate and regular rhythm.     Heart sounds: Normal heart sounds. No murmur. No friction rub. No gallop.   Pulmonary:     Effort: Pulmonary effort is normal. No respiratory distress.     Breath sounds: Normal breath sounds. No wheezing or rales.  Chest:     Chest wall: No tenderness.  Abdominal:     Palpations: Abdomen is soft.     Tenderness: There is no abdominal tenderness. There is no guarding.  Musculoskeletal:        General: Normal range of motion.     Cervical back: Normal range of motion and neck supple.  Lymphadenopathy:     Cervical: No cervical adenopathy.  Skin:    General: Skin is warm and dry.  Neurological:     Mental Status: He is alert and oriented to person, place, and time.     Cranial Nerves: No cranial nerve deficit.  Psychiatric:  Behavior: Behavior normal.        Thought Content: Thought content normal.        Judgment: Judgment normal.    Assessment/Plan: 1. Bilateral shoulder pain, unspecified chronicity Will get imaging, and follow up.  - DG Shoulder Left; Future - DG Shoulder Right; Future  2. Chronic bilateral low back pain with bilateral sciatica Physical therapy completed, no complaints at this time.   3. Shoulder joint crepitus, unspecified laterality - DG Shoulder Left; Future - DG Shoulder Right; Future  General Counseling: chidi shirer understanding of the findings of todays visit and agrees with plan of treatment. I have discussed any further diagnostic  evaluation that may be needed or ordered today. We also reviewed his medications today. he has been encouraged to call the office with any questions or concerns that should arise related to todays visit.   Orders Placed This Encounter  Procedures  . DG Shoulder Left  . DG Shoulder Right    No orders of the defined types were placed in this encounter.   Time spent: 30 Minutes  This patient was seen by Blima Ledger AGNP-C in Collaboration with Dr Lyndon Code as a part of collaborative care agreement.  Johnna Acosta AGNP-C Internal Medicine

## 2020-03-04 ENCOUNTER — Telehealth: Payer: Self-pay

## 2020-03-04 NOTE — Telephone Encounter (Signed)
Confirmed appointment on 03/08/2020 and screened for covid. klh °

## 2020-03-05 ENCOUNTER — Ambulatory Visit
Admission: RE | Admit: 2020-03-05 | Discharge: 2020-03-05 | Disposition: A | Discharge status: Home / Self Care | Attending: Adult Health | Admitting: Adult Health

## 2020-03-05 ENCOUNTER — Other Ambulatory Visit: Payer: Self-pay

## 2020-03-05 ENCOUNTER — Ambulatory Visit
Admission: RE | Admit: 2020-03-05 | Discharge: 2020-03-05 | Disposition: A | Discharge status: Home / Self Care | Source: Ambulatory Visit | Attending: Adult Health | Admitting: Adult Health

## 2020-03-05 DIAGNOSIS — M25511 Pain in right shoulder: Secondary | ICD-10-CM

## 2020-03-05 DIAGNOSIS — M25512 Pain in left shoulder: Secondary | ICD-10-CM | POA: Insufficient documentation

## 2020-03-05 DIAGNOSIS — M24819 Other specific joint derangements of unspecified shoulder, not elsewhere classified: Secondary | ICD-10-CM | POA: Insufficient documentation

## 2020-03-05 DIAGNOSIS — M25519 Pain in unspecified shoulder: Secondary | ICD-10-CM | POA: Diagnosis present

## 2020-03-08 ENCOUNTER — Encounter: Payer: Self-pay | Admitting: Adult Health

## 2020-03-08 ENCOUNTER — Ambulatory Visit (INDEPENDENT_AMBULATORY_CARE_PROVIDER_SITE_OTHER): Admitting: Adult Health

## 2020-03-08 VITALS — Ht 67.0 in | Wt 190.0 lb

## 2020-03-08 DIAGNOSIS — M25511 Pain in right shoulder: Secondary | ICD-10-CM | POA: Diagnosis not present

## 2020-03-08 DIAGNOSIS — J0100 Acute maxillary sinusitis, unspecified: Secondary | ICD-10-CM | POA: Diagnosis not present

## 2020-03-08 DIAGNOSIS — M25512 Pain in left shoulder: Secondary | ICD-10-CM | POA: Diagnosis not present

## 2020-03-08 DIAGNOSIS — T7840XA Allergy, unspecified, initial encounter: Secondary | ICD-10-CM

## 2020-03-08 MED ORDER — CETIRIZINE HCL 10 MG PO TABS: 10.0000 mg | ORAL_TABLET | Freq: Every day | ORAL | 11 refills | Status: AC

## 2020-03-08 MED ORDER — AZITHROMYCIN 250 MG PO TABS: ORAL_TABLET | ORAL | 0 refills | Status: DC

## 2020-03-08 MED ORDER — FLUTICASONE PROPIONATE 50 MCG/ACT NA SUSP: 2.0000 | Freq: Two times a day (BID) | NASAL | 6 refills | Status: AC

## 2020-03-08 NOTE — Progress Notes (Signed)
[facility] [place], [place] [address]  Internal MEDICINE  Telephone Visit  Patient Name: Evan Leach  [id number]  [id number]  Date of Service: 03/08/2020  I connected with the patient at 449 by video and verified the patients identity using two identifiers.   I discussed the limitations, risks, security and privacy concerns of performing an evaluation and management service by telephone and the availability of in person appointments. I also discussed with the patient that there may be a patient responsible charge related to the service.  The patient expressed understanding and agrees to proceed.    Chief Complaint  Patient presents with  . Telephone Screen    pt had covid Test today  . Telephone Assessment    all symptoms going on for last 3 days   . Follow-up  . Sinusitis  . Sore Throat  . Nasal Congestion  . Fever  . Fatigue  . Cough  . Follow-up    chest xray    HPI  PT seen via video.  He reports 3 days ago he had a sore throat.  The next day he began to have fever, headache and intense fatigue.  Today he was tested for covid, but does not have results.   Continues to report bilateral should discomfort and popping with weight lifting. He would like to see ortho for eval at this time.   Current Medication: Outpatient Encounter Medications as of 03/08/2020  Medication Sig  . azithromycin (ZITHROMAX) 250 MG tablet Take as directed  . cetirizine (ZYRTEC) 10 MG tablet Take 1 tablet (10 mg total) by mouth daily.  . fluticasone (FLONASE) 50 MCG/ACT nasal spray Place 2 sprays into both nostrils in the morning and at bedtime.   No facility-administered encounter medications on file as of 03/08/2020.    Surgical History: Past Surgical History:  Procedure Laterality Date  . WISDOM TOOTH EXTRACTION      Medical History: History reviewed. No pertinent past medical history.  Family History: Family History  Problem Relation Age of Onset  . Diabetes  Maternal Grandmother   . Diabetes Maternal Grandfather   . Diabetes Paternal Grandmother     Social History   Socioeconomic History  . Marital status: Single    Spouse name: Not on file  . Number of children: Not on file  . Years of education: Not on file  . Highest education level: Not on file  Occupational History  . Not on file  Tobacco Use  . Smoking status: Never Smoker  . Smokeless tobacco: Never Used  Substance and Sexual Activity  . Alcohol use: Yes    Comment: social  . Drug use: Never  . Sexual activity: Not on file  Other Topics Concern  . Not on file  Social History Narrative  . Not on file   Social Determinants of Health   Financial Resource Strain:   . Difficulty of Paying Living Expenses:   Food Insecurity:   . Worried About Charity fundraiser in the Last Year:   . Arboriculturist in the Last Year:   Transportation Needs:   . Film/video editor (Medical):   Marland Kitchen Lack of Transportation (Non-Medical):   Physical Activity:   . Days of Exercise per Week:   . Minutes of Exercise per Session:   Stress:   . Feeling of Stress :   Social Connections:   . Frequency of Communication with Friends and Family:   . Frequency of Social Gatherings  with Friends and Family:   . Attends Religious Services:   . Active Member of Clubs or Organizations:   . Attends Banker Meetings:   Marland Kitchen Marital Status:   Intimate Partner Violence:   . Fear of Current or Ex-Partner:   . Emotionally Abused:   Marland Kitchen Physically Abused:   . Sexually Abused:       Review of Systems  Constitutional: Negative.  Negative for chills, fatigue and unexpected weight change.  HENT: Negative.  Negative for congestion, rhinorrhea, sneezing and sore throat.   Eyes: Negative for redness.  Respiratory: Negative.  Negative for cough, chest tightness and shortness of breath.   Cardiovascular: Negative.  Negative for chest pain and palpitations.  Gastrointestinal: Negative.  Negative for  abdominal pain, constipation, diarrhea, nausea and vomiting.  Endocrine: Negative.   Genitourinary: Negative.  Negative for dysuria and frequency.  Musculoskeletal: Negative.  Negative for arthralgias, back pain, joint swelling and neck pain.  Skin: Negative.  Negative for rash.  Allergic/Immunologic: Negative.   Neurological: Negative.  Negative for tremors and numbness.  Hematological: Negative for adenopathy. Does not bruise/bleed easily.  Psychiatric/Behavioral: Negative.  Negative for behavioral problems, sleep disturbance and suicidal ideas. The patient is not nervous/anxious.     Vital Signs: Ht 5\' 7"  (1.702 m)   Wt 190 lb (86.2 kg)   BMI 29.76 kg/m    Observation/Objective:  Well appearing, NAD noted.    Assessment/Plan: 1. Subacute maxillary sinusitis Advised patient to take entire course of antibiotics as prescribed with food. Pt should return to clinic in 7-10 days if symptoms fail to improve or new symptoms develop.  - azithromycin (ZITHROMAX) 250 MG tablet; Take as directed  Dispense: 6 tablet; Refill: 0  2. Allergy, initial encounter Take Zyrtec and Flonase as prescribed.  - cetirizine (ZYRTEC) 10 MG tablet; Take 1 tablet (10 mg total) by mouth daily.  Dispense: 30 tablet; Refill: 11 - fluticasone (FLONASE) 50 MCG/ACT nasal spray; Place 2 sprays into both nostrils in the morning and at bedtime.  Dispense: 16 g; Refill: 6  3. Bilateral shoulder pain, unspecified chronicity Pt requested eval by Ortho. - Ambulatory referral to Orthopedic Surgery  General Counseling: keithan dileonardo understanding of the findings of today's phone visit and agrees with plan of treatment. I have discussed any further diagnostic evaluation that may be needed or ordered today. We also reviewed his medications today. he has been encouraged to call the office with any questions or concerns that should arise related to todays visit.    Orders Placed This Encounter  Procedures  .  Ambulatory referral to Orthopedic Surgery    Meds ordered this encounter  Medications  . azithromycin (ZITHROMAX) 250 MG tablet    Sig: Take as directed    Dispense:  6 tablet    Refill:  0  . cetirizine (ZYRTEC) 10 MG tablet    Sig: Take 1 tablet (10 mg total) by mouth daily.    Dispense:  30 tablet    Refill:  11  . fluticasone (FLONASE) 50 MCG/ACT nasal spray    Sig: Place 2 sprays into both nostrils in the morning and at bedtime.    Dispense:  16 g    Refill:  6    Time spent: 25 Minutes    Wallis Bamberg AGNP-C Internal medicine

## 2020-03-31 ENCOUNTER — Telehealth: Payer: Self-pay

## 2020-03-31 NOTE — Telephone Encounter (Signed)
Confirmed and screened for 04-01-20 ov. °

## 2020-04-01 ENCOUNTER — Ambulatory Visit (INDEPENDENT_AMBULATORY_CARE_PROVIDER_SITE_OTHER): Admitting: Nurse Practitioner

## 2020-04-01 ENCOUNTER — Other Ambulatory Visit: Payer: Self-pay

## 2020-04-01 ENCOUNTER — Encounter: Payer: Self-pay | Admitting: Adult Health

## 2020-04-01 VITALS — BP 122/77 | HR 60 | Temp 97.4°F | Resp 16 | Ht 67.0 in | Wt 193.0 lb

## 2020-04-01 DIAGNOSIS — M5431 Sciatica, right side: Secondary | ICD-10-CM | POA: Diagnosis not present

## 2020-04-01 DIAGNOSIS — R1031 Right lower quadrant pain: Secondary | ICD-10-CM

## 2020-04-01 DIAGNOSIS — M5442 Lumbago with sciatica, left side: Secondary | ICD-10-CM | POA: Diagnosis not present

## 2020-04-01 DIAGNOSIS — S39011A Strain of muscle, fascia and tendon of abdomen, initial encounter: Secondary | ICD-10-CM

## 2020-04-01 DIAGNOSIS — M5441 Lumbago with sciatica, right side: Secondary | ICD-10-CM

## 2020-04-01 MED ORDER — PREDNISONE 10 MG (21) PO TBPK: ORAL_TABLET | ORAL | 0 refills | Status: DC

## 2020-04-01 MED ORDER — CYCLOBENZAPRINE HCL 10 MG PO TABS: 10.0000 mg | ORAL_TABLET | Freq: Every evening | ORAL | 1 refills | Status: DC | PRN

## 2020-04-01 NOTE — Patient Instructions (Signed)
Sciatica Rehab Ask your health care provider which exercises are safe for you. Do exercises exactly as told by your health care provider and adjust them as directed. It is normal to feel mild stretching, pulling, tightness, or discomfort as you do these exercises. Stop right away if you feel sudden pain or your pain gets worse. Do not begin these exercises until told by your health care provider. Stretching and range-of-motion exercises These exercises warm up your muscles and joints and improve the movement and flexibility of your hips and back. These exercises also help to relieve pain, numbness, and tingling. Sciatic nerve glide 1. Sit in a chair with your head facing down toward your chest. Place your hands behind your back. Let your shoulders slump forward. 2. Slowly straighten one of your legs while you tilt your head back as if you are looking toward the ceiling. Only straighten your leg as far as you can without making your symptoms worse. 3. Hold this position for __________ seconds. 4. Slowly return to the starting position. 5. Repeat with your other leg. Repeat __________ times. Complete this exercise __________ times a day. Knee to chest with hip adduction and internal rotation  1. Lie on your back on a firm surface with both legs straight. 2. Bend one of your knees and move it up toward your chest until you feel a gentle stretch in your lower back and buttock. Then, move your knee toward the shoulder that is on the opposite side from your leg. This is hip adduction and internal rotation. ? Hold your leg in this position by holding on to the front of your knee. 3. Hold this position for __________ seconds. 4. Slowly return to the starting position. 5. Repeat with your other leg. Repeat __________ times. Complete this exercise __________ times a day. Prone extension on elbows  1. Lie on your abdomen on a firm surface. A bed may be too soft for this exercise. 2. Prop yourself up on  your elbows. 3. Use your arms to help lift your chest up until you feel a gentle stretch in your abdomen and your lower back. ? This will place some of your body weight on your elbows. If this is uncomfortable, try stacking pillows under your chest. ? Your hips should stay down, against the surface that you are lying on. Keep your hip and back muscles relaxed. 4. Hold this position for __________ seconds. 5. Slowly relax your upper body and return to the starting position. Repeat __________ times. Complete this exercise __________ times a day. Strengthening exercises These exercises build strength and endurance in your back. Endurance is the ability to use your muscles for a long time, even after they get tired. Pelvic tilt This exercise strengthens the muscles that lie deep in the abdomen. 1. Lie on your back on a firm surface. Bend your knees and keep your feet flat on the floor. 2. Tense your abdominal muscles. Tip your pelvis up toward the ceiling and flatten your lower back into the floor. ? To help with this exercise, you may place a small towel under your lower back and try to push your back into the towel. 3. Hold this position for __________ seconds. 4. Let your muscles relax completely before you repeat this exercise. Repeat __________ times. Complete this exercise __________ times a day. Alternating arm and leg raises  1. Get on your hands and knees on a firm surface. If you are on a hard floor, you may want to use   padding, such as an exercise mat, to cushion your knees. 2. Line up your arms and legs. Your hands should be directly below your shoulders, and your knees should be directly below your hips. 3. Lift your left leg behind you. At the same time, raise your right arm and straighten it in front of you. ? Do not lift your leg higher than your hip. ? Do not lift your arm higher than your shoulder. ? Keep your abdominal and back muscles tight. ? Keep your hips facing the  ground. ? Do not arch your back. ? Keep your balance carefully, and do not hold your breath. 4. Hold this position for __________ seconds. 5. Slowly return to the starting position. 6. Repeat with your right leg and your left arm. Repeat __________ times. Complete this exercise __________ times a day. Posture and body mechanics Good posture and healthy body mechanics can help to relieve stress in your body's tissues and joints. Body mechanics refers to the movements and positions of your body while you do your daily activities. Posture is part of body mechanics. Good posture means:  Your spine is in its natural S-curve position (neutral).  Your shoulders are pulled back slightly.  Your head is not tipped forward. Follow these guidelines to improve your posture and body mechanics in your everyday activities. Standing   When standing, keep your spine neutral and your feet about hip width apart. Keep a slight bend in your knees. Your ears, shoulders, and hips should line up.  When you do a task in which you stand in one place for a long time, place one foot up on a stable object that is 2-4 inches (5-10 cm) high, such as a footstool. This helps keep your spine neutral. Sitting   When sitting, keep your spine neutral and keep your feet flat on the floor. Use a footrest, if necessary, and keep your thighs parallel to the floor. Avoid rounding your shoulders, and avoid tilting your head forward.  When working at a desk or a computer, keep your desk at a height where your hands are slightly lower than your elbows. Slide your chair under your desk so you are close enough to maintain good posture.  When working at a computer, place your monitor at a height where you are looking straight ahead and you do not have to tilt your head forward or downward to look at the screen. Resting  When lying down and resting, avoid positions that are most painful for you.  If you have pain with activities  such as sitting, bending, stooping, or squatting, lie in a position in which your body does not bend very much. For example, avoid curling up on your side with your arms and knees near your chest (fetal position).  If you have pain with activities such as standing for a long time or reaching with your arms, lie with your spine in a neutral position and bend your knees slightly. Try the following positions: ? Lying on your side with a pillow between your knees. ? Lying on your back with a pillow under your knees. Lifting   When lifting objects, keep your feet at least shoulder width apart and tighten your abdominal muscles.  Bend your knees and hips and keep your spine neutral. It is important to lift using the strength of your legs, not your back. Do not lock your knees straight out.  Always ask for help to lift heavy or awkward objects. This information is not   intended to replace advice given to you by your health care provider. Make sure you discuss any questions you have with your health care provider. Document Revised: 02/07/2019 Document Reviewed: 11/07/2018 [employer] Patient Education  2020 [employer]  

## 2020-04-01 NOTE — Progress Notes (Signed)
[facility] [place], [place] [address]  Internal MEDICINE  Office Visit Note  Patient Name: Evan Leach  [id number]  [id number]  Date of Service: 04/07/2020  Chief Complaint  Patient presents with   Acute Visit    hip pain, lower back pain started 3 weeks ago,sharp pain occ      The patient is here for acute visit. States that he started having lower right back pain which radiates to right groin. This started a few weeks ago after a long hike with approximately 70 pounds on his back. Went from walking to running. The entire hike was approximately 4.3 miles in length. He states that pain was relatively intermittent until this past Tuesday when he was weight lifting and using a leg press. Was lifting about 225 pounds. An hour later, he felt sharp pain in lower back, radiating around to right groin. Has had tingling/numbness sensation running down the right leg as well. Has taken 800mg  ibuprofen yesterday. States that this did not help.   Pt is here for a sick visit.     Current Medication:  Outpatient Encounter Medications as of 04/01/2020  Medication Sig   azithromycin (ZITHROMAX) 250 MG tablet Take as directed   cetirizine (ZYRTEC) 10 MG tablet Take 1 tablet (10 mg total) by mouth daily.   cyclobenzaprine (FLEXERIL) 10 MG tablet Take 1 tablet (10 mg total) by mouth at bedtime as needed for muscle spasms.   fluticasone (FLONASE) 50 MCG/ACT nasal spray Place 2 sprays into both nostrils in the morning and at bedtime.   predniSONE (STERAPRED UNI-PAK 21 TAB) 10 MG (21) TBPK tablet 6 day taper - take by mouth as directed for 6 days   No facility-administered encounter medications on file as of 04/01/2020.      Medical History: History reviewed. No pertinent past medical history.   Today's Vitals   04/01/20 1423  BP: 122/77  Pulse: 60  Resp: 16  Temp: (!) 97.4 F (36.3 C)  SpO2: 98%  Weight: 193 lb (87.5 kg)  Height: 5\' 7"  (1.702 m)   Body  mass index is 30.23 kg/m.  Review of Systems  Constitutional: Negative for activity change, chills, fatigue and unexpected weight change.  HENT: Negative for congestion, postnasal drip, rhinorrhea, sneezing and sore throat.   Respiratory: Negative for cough, chest tightness and shortness of breath.   Cardiovascular: Negative for chest pain and palpitations.  Gastrointestinal: Negative for abdominal pain, constipation, diarrhea, nausea and vomiting.  Musculoskeletal: Positive for arthralgias, back pain and myalgias. Negative for joint swelling and neck pain.       Right lower back pain which radiates into the right hip and right groin. Gets worse with exertion and activity.   Skin: Negative for rash.  Allergic/Immunologic: Negative for environmental allergies.  Neurological: Negative for dizziness, tremors, numbness and headaches.  Hematological: Negative for adenopathy. Does not bruise/bleed easily.  Psychiatric/Behavioral: Negative for behavioral problems (Depression), sleep disturbance and suicidal ideas. The patient is not nervous/anxious.     Physical Exam Vitals and nursing note reviewed.  Constitutional:      General: He is not in acute distress.    Appearance: Normal appearance. He is well-developed. He is not diaphoretic.  HENT:     Head: Normocephalic and atraumatic.     Mouth/Throat:     Pharynx: No oropharyngeal exudate.  Eyes:     Pupils: Pupils are equal, round, and reactive to light.  Neck:     Thyroid: No thyromegaly.  Vascular: No JVD.     Trachea: No tracheal deviation.  Cardiovascular:     Rate and Rhythm: Normal rate and regular rhythm.     Heart sounds: Normal heart sounds. No murmur. No friction rub. No gallop.   Pulmonary:     Effort: Pulmonary effort is normal. No respiratory distress.     Breath sounds: Normal breath sounds. No wheezing or rales.  Chest:     Chest wall: No tenderness.  Abdominal:     Palpations: Abdomen is soft.     Tenderness:  There is no abdominal tenderness.     Hernia: No hernia is present.  Musculoskeletal:        General: Normal range of motion.     Cervical back: Normal range of motion and neck supple.     Comments: Tenderness with palpation of the right lower back, right hip, and right groin area. There are no palpable abnormalities or deformities noted at this time. There is minimal loss of strength and ROM due to pain. No inguinal hernia felt with palpation of the groin area.   Lymphadenopathy:     Cervical: No cervical adenopathy.  Skin:    General: Skin is warm and dry.  Neurological:     Mental Status: He is alert and oriented to person, place, and time.     Cranial Nerves: No cranial nerve deficit.  Psychiatric:        Mood and Affect: Mood normal.        Behavior: Behavior normal.        Thought Content: Thought content normal.        Judgment: Judgment normal.    Assessment/Plan: 1. Strain of muscle of right groin region X-ray right hip for further evaluation. Refer to orthopedics as indicated. - DG Hip Unilat W OR W/O Pelvis Min 4 Views Right; Future  2. Acute right-sided low back pain with bilateral sciatica Start prednisone taper. Take as directed for 6 days. May take flexeril 10mg  at bedtime as needed for muscle pain and spasms.  - predniSONE (STERAPRED UNI-PAK 21 TAB) 10 MG (21) TBPK tablet; 6 day taper - take by mouth as directed for 6 days  Dispense: 21 tablet; Refill: 0 - cyclobenzaprine (FLEXERIL) 10 MG tablet; Take 1 tablet (10 mg total) by mouth at bedtime as needed for muscle spasms.  Dispense: 30 tablet; Refill: 1  3. Right sided sciatica X-ray lumbar spine for further evaluation.  - DG Lumbar Spine Complete; Future   General Counseling: webb weed understanding of the findings of todays visit and agrees with plan of treatment. I have discussed any further diagnostic evaluation that may be needed or ordered today. We also reviewed his medications today. he has been  encouraged to call the office with any questions or concerns that should arise related to todays visit.    Counseling:  This patient was seen by Wallis Bamberg FNP Collaboration with Dr Vincent Gros as a part of collaborative care agreement  Orders Placed This Encounter  Procedures   DG Lumbar Spine Complete   DG Hip Unilat W OR W/O Pelvis Min 4 Views Right    Meds ordered this encounter  Medications   predniSONE (STERAPRED UNI-PAK 21 TAB) 10 MG (21) TBPK tablet    Sig: 6 day taper - take by mouth as directed for 6 days    Dispense:  21 tablet    Refill:  0    Order Specific Question:   Supervising Provider  Answer:   Lyndon Code [[id number]]   cyclobenzaprine (FLEXERIL) 10 MG tablet    Sig: Take 1 tablet (10 mg total) by mouth at bedtime as needed for muscle spasms.    Dispense:  30 tablet    Refill:  1    Order Specific Question:   Supervising Provider    Answer:   Lyndon Code [[id number]]    Time spent: 30 Minutes

## 2020-04-07 ENCOUNTER — Ambulatory Visit
Admission: RE | Admit: 2020-04-07 | Discharge: 2020-04-07 | Disposition: A | Discharge status: Home / Self Care | Attending: Nurse Practitioner | Admitting: Nurse Practitioner

## 2020-04-07 ENCOUNTER — Other Ambulatory Visit: Payer: Self-pay

## 2020-04-07 ENCOUNTER — Ambulatory Visit
Admission: RE | Admit: 2020-04-07 | Discharge: 2020-04-07 | Disposition: A | Discharge status: Home / Self Care | Source: Ambulatory Visit | Attending: Nurse Practitioner | Admitting: Nurse Practitioner

## 2020-04-07 DIAGNOSIS — S39011A Strain of muscle, fascia and tendon of abdomen, initial encounter: Secondary | ICD-10-CM | POA: Insufficient documentation

## 2020-04-07 DIAGNOSIS — S39013A Strain of muscle, fascia and tendon of pelvis, initial encounter: Secondary | ICD-10-CM | POA: Insufficient documentation

## 2020-04-07 DIAGNOSIS — M5431 Sciatica, right side: Secondary | ICD-10-CM | POA: Insufficient documentation

## 2020-04-07 DIAGNOSIS — M5442 Lumbago with sciatica, left side: Secondary | ICD-10-CM | POA: Insufficient documentation

## 2020-04-07 DIAGNOSIS — M5441 Lumbago with sciatica, right side: Secondary | ICD-10-CM | POA: Insufficient documentation

## 2020-04-07 DIAGNOSIS — R1031 Right lower quadrant pain: Secondary | ICD-10-CM | POA: Insufficient documentation

## 2020-04-15 ENCOUNTER — Telehealth: Payer: Self-pay

## 2020-04-15 NOTE — Telephone Encounter (Signed)
Called lmom informing patient of appointment on 06/21/20221. klh

## 2020-04-19 ENCOUNTER — Ambulatory Visit: Admitting: Adult Health

## 2020-04-20 ENCOUNTER — Telehealth: Payer: Self-pay

## 2020-04-20 NOTE — Telephone Encounter (Signed)
Confirmed appointment on 04/22/2020 and screened for covid. klh 

## 2020-04-22 ENCOUNTER — Encounter: Payer: Self-pay | Admitting: Adult Health

## 2020-04-22 ENCOUNTER — Ambulatory Visit (INDEPENDENT_AMBULATORY_CARE_PROVIDER_SITE_OTHER): Admitting: Adult Health

## 2020-04-22 ENCOUNTER — Other Ambulatory Visit: Payer: Self-pay

## 2020-04-22 VITALS — BP 124/79 | HR 72 | Temp 97.6°F | Resp 16 | Ht 67.0 in | Wt 193.6 lb

## 2020-04-22 DIAGNOSIS — M5431 Sciatica, right side: Secondary | ICD-10-CM

## 2020-04-22 DIAGNOSIS — S39011A Strain of muscle, fascia and tendon of abdomen, initial encounter: Secondary | ICD-10-CM
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
## 2020-06-01 ENCOUNTER — Telehealth: Payer: Self-pay

## 2020-06-01 NOTE — Telephone Encounter (Signed)
Lmom to confirm and screen for 06-03-20 ov. 

## 2020-06-03 ENCOUNTER — Ambulatory Visit: Admitting: Adult Health

## 2020-06-14 ENCOUNTER — Emergency Department
Admission: EM | Admit: 2020-06-14 | Discharge: 2020-06-14 | Disposition: A | Discharge status: Home / Self Care | Attending: Emergency Medicine | Admitting: Emergency Medicine

## 2020-06-14 ENCOUNTER — Emergency Department

## 2020-06-14 ENCOUNTER — Other Ambulatory Visit: Payer: Self-pay

## 2020-06-14 DIAGNOSIS — Y9389 Activity, other specified: Secondary | ICD-10-CM | POA: Diagnosis not present

## 2020-06-14 DIAGNOSIS — Y9289 Other specified places as the place of occurrence of the external cause: Secondary | ICD-10-CM | POA: Diagnosis not present

## 2020-06-14 DIAGNOSIS — S060X0A Concussion without loss of consciousness, initial encounter: Secondary | ICD-10-CM | POA: Insufficient documentation

## 2020-06-14 DIAGNOSIS — S161XXA Strain of muscle, fascia and tendon at neck level, initial encounter: Secondary | ICD-10-CM

## 2020-06-14 DIAGNOSIS — Y998 Other external cause status: Secondary | ICD-10-CM | POA: Insufficient documentation

## 2020-06-14 DIAGNOSIS — R519 Headache, unspecified: Secondary | ICD-10-CM | POA: Diagnosis not present

## 2020-06-14 DIAGNOSIS — S63501A Unspecified sprain of right wrist, initial encounter: Secondary | ICD-10-CM | POA: Diagnosis not present

## 2020-06-14 DIAGNOSIS — M542 Cervicalgia: Secondary | ICD-10-CM | POA: Diagnosis present

## 2020-06-14 NOTE — Discharge Instructions (Addendum)
Please take ibuprofen 600-800mg  every 6-8 hours as needed for pain and inflammation. You may also take tylenol 500mg  every 4-6 hours as needed. Please take this with your flexeril you were previously prescribed for your back.
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
## 2020-06-17 ENCOUNTER — Emergency Department: Admission: EM | Admit: 2020-06-17 | Discharge: 2020-06-18 | Discharge status: Against Medical Advice

## 2020-06-17 ENCOUNTER — Telehealth: Payer: Self-pay

## 2020-06-17 NOTE — Telephone Encounter (Signed)
confirmed telephone visit for 8/23

## 2020-06-21 ENCOUNTER — Encounter: Payer: Self-pay | Admitting: Adult Health

## 2020-06-21 ENCOUNTER — Ambulatory Visit (INDEPENDENT_AMBULATORY_CARE_PROVIDER_SITE_OTHER): Admitting: Adult Health

## 2020-06-21 VITALS — BP 140/85 | Ht 69.0 in | Wt 190.0 lb

## 2020-06-21 DIAGNOSIS — M5442 Lumbago with sciatica, left side: Secondary | ICD-10-CM

## 2020-06-21 DIAGNOSIS — S060X0D Concussion without loss of consciousness, subsequent encounter: Secondary | ICD-10-CM

## 2020-06-21 DIAGNOSIS — M5441 Lumbago with sciatica, right side: Secondary | ICD-10-CM | POA: Diagnosis not present

## 2020-06-21 DIAGNOSIS — S39011A Strain of muscle, fascia and tendon of abdomen, initial encounter: Secondary | ICD-10-CM
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
## 2020-08-16 ENCOUNTER — Telehealth: Payer: Self-pay

## 2020-08-16 NOTE — Telephone Encounter (Signed)
Faxed requested medical records to [facility] at [phone].  Faxed medical record payment request to [phone].

## 2020-10-21 ENCOUNTER — Telehealth: Payer: Self-pay

## 2020-10-21 NOTE — Telephone Encounter (Signed)
Completed medical record request and mailed requesting records to [facility] [place] [address] [place] [place], [place] [address]. Faxed medical records payment request form to [phone]. 

## 2021-01-10 ENCOUNTER — Encounter: Admitting: Physician Assistant
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
## 2021-08-22 ENCOUNTER — Telehealth: Payer: Self-pay

## 2021-08-22 NOTE — Telephone Encounter (Signed)
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 Release form was not filled out and signed in full. Legally unable to complete the medical records request at this time. Called and left a voice mail with patients S/O.

## 2021-09-01 ENCOUNTER — Telehealth: Payer: Self-pay

## 2021-09-01 LAB — HM HIV SCREENING LAB: HM HIV Screening: NEGATIVE

## 2021-09-01 NOTE — Telephone Encounter (Signed)
Medical record request completed with requesting medical records for patient's deployment. Patients Fiance whom is on his PHI/[employer] form has been notified ready for pickup.

## 2021-09-15 ENCOUNTER — Encounter: Payer: Self-pay | Admitting: Internal Medicine

## 2021-09-16 ENCOUNTER — Ambulatory Visit (INDEPENDENT_AMBULATORY_CARE_PROVIDER_SITE_OTHER): Admitting: Physician Assistant

## 2021-09-16 ENCOUNTER — Encounter: Payer: Self-pay | Admitting: Physician Assistant

## 2021-09-16 ENCOUNTER — Other Ambulatory Visit: Payer: Self-pay

## 2021-09-16 DIAGNOSIS — G8929 Other chronic pain: Secondary | ICD-10-CM

## 2021-09-16 DIAGNOSIS — G43709 Chronic migraine without aura, not intractable, without status migrainosus: Secondary | ICD-10-CM

## 2021-09-16 DIAGNOSIS — R5383 Other fatigue: Secondary | ICD-10-CM

## 2021-09-16 DIAGNOSIS — M5442 Lumbago with sciatica, left side: Secondary | ICD-10-CM | POA: Diagnosis not present

## 2021-09-16 DIAGNOSIS — M25511 Pain in right shoulder: Secondary | ICD-10-CM

## 2021-09-16 DIAGNOSIS — Z87820 Personal history of traumatic brain injury: Secondary | ICD-10-CM

## 2021-09-16 DIAGNOSIS — M5441 Lumbago with sciatica, right side: Secondary | ICD-10-CM

## 2021-09-16 DIAGNOSIS — R350 Frequency of micturition: Secondary | ICD-10-CM

## 2021-09-16 DIAGNOSIS — Z5682 Military deployment status: Secondary | ICD-10-CM

## 2021-09-16 MED ORDER — CYCLOBENZAPRINE HCL 5 MG PO TABS: 5.0000 mg | ORAL_TABLET | Freq: Every day | ORAL | 1 refills | Status: AC
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
## 2021-09-19 ENCOUNTER — Telehealth: Payer: Self-pay

## 2021-09-19 NOTE — Telephone Encounter (Signed)
Neurology referral sent via [facility] to [facility] 

## 2021-09-26 NOTE — Telephone Encounter (Signed)
Neuro appointment scheduled for 10/05/21 @ 1:00-Toni

## 2021-09-30 ENCOUNTER — Ambulatory Visit: Admitting: Physician Assistant

## 2021-09-30 ENCOUNTER — Telehealth: Payer: Self-pay

## 2021-09-30 ENCOUNTER — Ambulatory Visit (INDEPENDENT_AMBULATORY_CARE_PROVIDER_SITE_OTHER): Admitting: Physician Assistant

## 2021-09-30 ENCOUNTER — Encounter: Payer: Self-pay | Admitting: Physician Assistant

## 2021-09-30 ENCOUNTER — Other Ambulatory Visit: Payer: Self-pay

## 2021-09-30 DIAGNOSIS — M5442 Lumbago with sciatica, left side: Secondary | ICD-10-CM | POA: Diagnosis not present

## 2021-09-30 DIAGNOSIS — G8929 Other chronic pain: Secondary | ICD-10-CM

## 2021-09-30 DIAGNOSIS — G43709 Chronic migraine without aura, not intractable, without status migrainosus: Secondary | ICD-10-CM

## 2021-09-30 DIAGNOSIS — Z0289 Encounter for other administrative examinations: Secondary | ICD-10-CM

## 2021-09-30 DIAGNOSIS — Z87820 Personal history of traumatic brain injury: Secondary | ICD-10-CM

## 2021-09-30 DIAGNOSIS — M25511 Pain in right shoulder: Secondary | ICD-10-CM | POA: Diagnosis not present

## 2021-09-30 DIAGNOSIS — M5441 Lumbago with sciatica, right side: Secondary | ICD-10-CM

## 2021-09-30 NOTE — Progress Notes (Signed)
[facility] [address] [place], [place] [address]  Internal MEDICINE  Office Visit Note  Patient Name: Evan Leach  [id number]  [id number]  Date of Service: 10/02/2021  Chief Complaint  Patient presents with   Form Completion    For [employer] paperwork for restrictions    HPI Pt is here with functional capacity paperwork for the [employer]. -We discussed that most of the things on the form cannot be full assessed in person and that he was referred to neurology and PMR for further evaluation and treatment of multiple medication conditions that limit his physical capabilities. Patient understands and states he spoke with his superior officer regarding this and that paperwork will be filled out to the best of my ability. He understands that the duration or specifics of injuries cannot be determined at this time, but that at this time he is unable to complete most of the items listed without worsening condition. -He was given a copy of his note from last visit that documents the reasons for these referrals to take in conjunction with his paperwork. -Copy of paperwork made in office today  Current Medication: Outpatient Encounter Medications as of 09/30/2021  Medication Sig   cetirizine (ZYRTEC) 10 MG tablet Take 1 tablet (10 mg total) by mouth daily.   cyclobenzaprine (FLEXERIL) 5 MG tablet Take 1 tablet (5 mg total) by mouth at bedtime.   fluticasone (FLONASE) 50 MCG/ACT nasal spray Place 2 sprays into both nostrils in the morning and at bedtime.   hydrOXYzine (ATARAX/VISTARIL) 25 MG tablet Take by mouth.   ibuprofen (ADVIL) 600 MG tablet Take by mouth.   indomethacin (INDOCIN) 25 MG capsule Take by mouth.   omeprazole (PRILOSEC) 20 MG capsule Take by mouth.   No facility-administered encounter medications on file as of 09/30/2021.    Surgical History: Past Surgical History:  Procedure Laterality Date   WISDOM TOOTH EXTRACTION      Medical History: Past Medical History:   Diagnosis Date   Anxiety    Depression    GERD (gastroesophageal reflux disease)     Family History: Family History  Problem Relation Age of Onset   Diabetes Maternal Grandmother    Diabetes Maternal Grandfather    Diabetes Paternal Grandmother     Social History   Socioeconomic History   Marital status: Single    Spouse name: Not on file   Number of children: Not on file   Years of education: Not on file   Highest education level: Not on file  Occupational History   Not on file  Tobacco Use   Smoking status: Never   Smokeless tobacco: Never  Substance and Sexual Activity   Alcohol use: Yes    Comment: social   Drug use: Never   Sexual activity: Not on file  Other Topics Concern   Not on file  Social History Narrative   Not on file   Social Determinants of Health   Financial Resource Strain: Not on file  Food Insecurity: Not on file  Transportation Needs: Not on file  Physical Activity: Not on file  Stress: Not on file  Social Connections: Not on file  Intimate Partner Violence: Not on file      Review of Systems  Constitutional:  Negative for chills, fatigue and unexpected weight change.  HENT:  Negative for congestion, postnasal drip, rhinorrhea, sneezing and sore throat.   Eyes:  Negative for redness.  Respiratory:  Negative for cough, chest tightness and shortness of breath.  Cardiovascular:  Negative for chest pain and palpitations.  Gastrointestinal:  Negative for abdominal pain, constipation, diarrhea, nausea and vomiting.  Genitourinary:  Positive for frequency. Negative for dysuria.  Musculoskeletal:  Positive for arthralgias and back pain. Negative for joint swelling and neck pain.  Skin:  Negative for rash.  Neurological:  Positive for numbness and headaches. Negative for tremors.  Hematological:  Negative for adenopathy. Does not bruise/bleed easily.  Psychiatric/Behavioral:  Negative for behavioral problems (Depression), sleep disturbance  and suicidal ideas. The patient is not nervous/anxious.    Vital Signs: BP 129/86   Pulse (!) 52   Temp 98.5 F (36.9 C)   Resp 16   Ht 5\' 8"  (1.727 m)   Wt 201 lb (91.2 kg)   SpO2 99%   BMI 30.56 kg/m    Physical Exam Vitals and nursing note reviewed.  Constitutional:      General: He is not in acute distress.    Appearance: He is well-developed. He is not diaphoretic.  HENT:     Head: Normocephalic and atraumatic.     Mouth/Throat:     Pharynx: No oropharyngeal exudate.  Eyes:     Pupils: Pupils are equal, round, and reactive to light.  Neck:     Thyroid: No thyromegaly.     Vascular: No JVD.     Trachea: No tracheal deviation.  Cardiovascular:     Rate and Rhythm: Normal rate and regular rhythm.     Heart sounds: Normal heart sounds. No murmur heard.   No friction rub. No gallop.  Pulmonary:     Effort: Pulmonary effort is normal. No respiratory distress.     Breath sounds: No wheezing or rales.  Chest:     Chest wall: No tenderness.  Abdominal:     General: Bowel sounds are normal.     Palpations: Abdomen is soft.  Musculoskeletal:        General: No tenderness. Normal range of motion.     Cervical back: Normal range of motion and neck supple.     Comments: No TTP of low back or right shoulder, pain with movement and ROM  Lymphadenopathy:     Cervical: No cervical adenopathy.  Skin:    General: Skin is warm and dry.  Neurological:     Mental Status: He is alert and oriented to person, place, and time.     Cranial Nerves: No cranial nerve deficit.  Psychiatric:        Behavior: Behavior normal.        Thought Content: Thought content normal.        Judgment: Judgment normal.       Assessment/Plan: 1. Encounter for completion of form with patient Form completed as able and given to patient  2. Chronic right shoulder pain Referred to PMR for further eval and treatment  3. Chronic bilateral low back pain with bilateral sciatica Referred to PMR and  neurology for further eval and treatment  4. Chronic migraine without aura without status migrainosus, not intractable Referred to neurology due to previous concussion and lasting symptoms  5. History of concussion Referred to neurology due to previous concussion and lasting symptoms   General Counseling: dorion petillo understanding of the findings of todays visit and agrees with plan of treatment. I have discussed any further diagnostic evaluation that may be needed or ordered today. We also reviewed his medications today. he has been encouraged to call the office with any questions or concerns that should arise related  to todays visit.    No orders of the defined types were placed in this encounter.   No orders of the defined types were placed in this encounter.   This patient was seen by Lynn Ito, PA-C in collaboration with Dr. Beverely Risen as a part of collaborative care agreement.   Total time spent:30 Minutes Time spent includes review of chart, medications, test results, and follow up plan with the patient.      Dr Lyndon Code Internal medicine

## 2021-09-30 NOTE — Telephone Encounter (Signed)
Spoke with patient. Per provider and office manager, he needs to contact his superior to find out where he needs to go to have form completed for [employer]

## 2021-10-04 ENCOUNTER — Telehealth: Payer: Self-pay

## 2021-10-04 NOTE — Telephone Encounter (Signed)
Patient was referred to [facility] on 09/16/21. I spoke with [name] today to see if anyone has reached out to patient to schedule. She stated she would send referral over to sports and have them contact patient to schedule-Evan Leach

## 2021-10-06 ENCOUNTER — Ambulatory Visit: Attending: Physician Assistant | Admitting: Physical Therapy

## 2021-10-06 ENCOUNTER — Encounter: Payer: Self-pay | Admitting: Physical Therapy

## 2021-10-06 DIAGNOSIS — M6281 Muscle weakness (generalized): Secondary | ICD-10-CM | POA: Insufficient documentation

## 2021-10-06 DIAGNOSIS — M25511 Pain in right shoulder: Secondary | ICD-10-CM | POA: Diagnosis present

## 2021-10-06 DIAGNOSIS — G8929 Other chronic pain: Secondary | ICD-10-CM | POA: Diagnosis present

## 2021-10-06 NOTE — Progress Notes (Signed)
   10/06/21 1727  PT SHORT TERM GOAL #1  Title Patient will be independent with HEP to self-treat underlyign condition.  Baseline 12/8: NT  Time 2  Period Weeks  Status New  Target Date 10/20/21
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
## 2021-10-06 NOTE — Progress Notes (Addendum)
   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  Baseline 10/06/21: NT  Time 4  Period Weeks  Status New  Target Date 11/03/21  PT LONG TERM GOAL #2  Title Patient will have improved function and activity level as evidenced by an increase in FOTO score by 10 points or more.  Baseline 10/06/21: 59/69  Time 4  Period Weeks  Status New  Target Date 11/03/21

## 2021-10-11 ENCOUNTER — Other Ambulatory Visit: Payer: Self-pay | Admitting: Physician Assistant

## 2021-10-11 ENCOUNTER — Encounter: Payer: Self-pay | Admitting: Physical Therapy

## 2021-10-11 ENCOUNTER — Ambulatory Visit: Admitting: Physical Therapy

## 2021-10-11 DIAGNOSIS — M25511 Pain in right shoulder: Secondary | ICD-10-CM

## 2021-10-11 DIAGNOSIS — G479 Sleep disorder, unspecified: Secondary | ICD-10-CM

## 2021-10-11 DIAGNOSIS — R519 Headache, unspecified: Secondary | ICD-10-CM

## 2021-10-11 DIAGNOSIS — R202 Paresthesia of skin: Secondary | ICD-10-CM

## 2021-10-11 DIAGNOSIS — G8929 Other chronic pain: Secondary | ICD-10-CM

## 2021-10-11 DIAGNOSIS — R4189 Other symptoms and signs involving cognitive functions and awareness: Secondary | ICD-10-CM

## 2021-10-11 DIAGNOSIS — H53149 Visual discomfort, unspecified: Secondary | ICD-10-CM

## 2021-10-11 DIAGNOSIS — F40298 Other specified phobia: Secondary | ICD-10-CM

## 2021-10-11 DIAGNOSIS — R200 Anesthesia of skin: Secondary | ICD-10-CM

## 2021-10-11 DIAGNOSIS — G43109 Migraine with aura, not intractable, without status migrainosus: Secondary | ICD-10-CM

## 2021-10-11 DIAGNOSIS — M5416 Radiculopathy, lumbar region: Secondary | ICD-10-CM
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
## 2021-10-13 ENCOUNTER — Ambulatory Visit: Admitting: Physical Therapy

## 2021-10-13 DIAGNOSIS — M25511 Pain in right shoulder: Secondary | ICD-10-CM | POA: Diagnosis not present

## 2021-10-13 DIAGNOSIS — G8929 Other chronic pain: Secondary | ICD-10-CM
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
## 2021-10-18 ENCOUNTER — Ambulatory Visit: Admitting: Physical Therapy

## 2021-10-18 ENCOUNTER — Telehealth: Payer: Self-pay | Admitting: Physical Therapy

## 2021-10-18 NOTE — Telephone Encounter (Signed)
Called pt to inquiry about his absence. Pt stated that he was unaware that he had an apt today and that he is sorry for missing apt. PT instructed pt on future apts and pt stated that he has a schedule and he is aware of future dates.

## 2021-10-19 ENCOUNTER — Ambulatory Visit
Admission: RE | Admit: 2021-10-19 | Discharge: 2021-10-19 | Disposition: A | Discharge status: Home / Self Care | Source: Ambulatory Visit | Attending: Physician Assistant | Admitting: Physician Assistant

## 2021-10-19 DIAGNOSIS — R202 Paresthesia of skin: Secondary | ICD-10-CM | POA: Insufficient documentation

## 2021-10-19 DIAGNOSIS — M5416 Radiculopathy, lumbar region: Secondary | ICD-10-CM | POA: Diagnosis present

## 2021-10-19 DIAGNOSIS — R200 Anesthesia of skin: Secondary | ICD-10-CM | POA: Insufficient documentation

## 2021-10-20 ENCOUNTER — Ambulatory Visit: Admitting: Physical Therapy

## 2021-10-20 DIAGNOSIS — M6281 Muscle weakness (generalized): Secondary | ICD-10-CM

## 2021-10-20 DIAGNOSIS — M25511 Pain in right shoulder: Secondary | ICD-10-CM | POA: Diagnosis not present
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
## 2021-10-25 ENCOUNTER — Other Ambulatory Visit: Payer: Self-pay

## 2021-10-25 ENCOUNTER — Ambulatory Visit
Admission: RE | Admit: 2021-10-25 | Discharge: 2021-10-25 | Disposition: A | Discharge status: Home / Self Care | Source: Ambulatory Visit | Attending: Physician Assistant | Admitting: Physician Assistant

## 2021-10-25 DIAGNOSIS — H53149 Visual discomfort, unspecified: Secondary | ICD-10-CM | POA: Insufficient documentation

## 2021-10-25 DIAGNOSIS — G479 Sleep disorder, unspecified: Secondary | ICD-10-CM | POA: Insufficient documentation

## 2021-10-25 DIAGNOSIS — R519 Headache, unspecified: Secondary | ICD-10-CM | POA: Diagnosis present

## 2021-10-25 DIAGNOSIS — F40298 Other specified phobia: Secondary | ICD-10-CM | POA: Insufficient documentation

## 2021-10-25 DIAGNOSIS — R4189 Other symptoms and signs involving cognitive functions and awareness: Secondary | ICD-10-CM | POA: Diagnosis present

## 2021-10-25 DIAGNOSIS — R202 Paresthesia of skin: Secondary | ICD-10-CM | POA: Diagnosis present

## 2021-10-25 DIAGNOSIS — G43109 Migraine with aura, not intractable, without status migrainosus: Secondary | ICD-10-CM | POA: Insufficient documentation

## 2021-10-25 DIAGNOSIS — R200 Anesthesia of skin: Secondary | ICD-10-CM | POA: Diagnosis present

## 2021-10-27 ENCOUNTER — Ambulatory Visit: Admitting: Physical Therapy

## 2021-10-27 DIAGNOSIS — M25511 Pain in right shoulder: Secondary | ICD-10-CM | POA: Diagnosis not present

## 2021-10-27 DIAGNOSIS — M6281 Muscle weakness (generalized): Secondary | ICD-10-CM
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
## 2021-11-01 ENCOUNTER — Ambulatory Visit: Admitting: Physical Therapy

## 2021-11-03 ENCOUNTER — Ambulatory Visit: Admitting: Physical Therapy

## 2021-11-08 ENCOUNTER — Ambulatory Visit: Attending: Physician Assistant | Admitting: Physical Therapy

## 2021-11-08 DIAGNOSIS — M25511 Pain in right shoulder: Secondary | ICD-10-CM | POA: Insufficient documentation

## 2021-11-08 DIAGNOSIS — G8929 Other chronic pain: Secondary | ICD-10-CM

## 2021-11-08 DIAGNOSIS — M546 Pain in thoracic spine: Secondary | ICD-10-CM | POA: Insufficient documentation

## 2021-11-08 DIAGNOSIS — M545 Low back pain, unspecified: Secondary | ICD-10-CM | POA: Insufficient documentation

## 2021-11-08 DIAGNOSIS — M6281 Muscle weakness (generalized): Secondary | ICD-10-CM
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
## 2021-11-10 ENCOUNTER — Ambulatory Visit: Admitting: Physical Therapy

## 2021-11-10 DIAGNOSIS — M25511 Pain in right shoulder: Secondary | ICD-10-CM | POA: Diagnosis present

## 2021-11-10 DIAGNOSIS — G8929 Other chronic pain: Secondary | ICD-10-CM

## 2021-11-10 DIAGNOSIS — M6281 Muscle weakness (generalized): Secondary | ICD-10-CM

## 2021-11-10 DIAGNOSIS — M546 Pain in thoracic spine: Secondary | ICD-10-CM | POA: Diagnosis present

## 2021-11-10 DIAGNOSIS — M545 Low back pain, unspecified: Secondary | ICD-10-CM | POA: Diagnosis present
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
## 2021-11-15 ENCOUNTER — Ambulatory Visit: Admitting: Physical Therapy

## 2021-11-15 DIAGNOSIS — M25511 Pain in right shoulder: Secondary | ICD-10-CM | POA: Diagnosis not present

## 2021-11-15 DIAGNOSIS — G8929 Other chronic pain: Secondary | ICD-10-CM

## 2021-11-15 DIAGNOSIS — M6281 Muscle weakness (generalized): Secondary | ICD-10-CM
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
## 2021-11-17 ENCOUNTER — Ambulatory Visit: Admitting: Physical Therapy

## 2021-11-22 ENCOUNTER — Other Ambulatory Visit: Payer: Self-pay

## 2021-11-22 ENCOUNTER — Ambulatory Visit

## 2021-11-22 DIAGNOSIS — M6281 Muscle weakness (generalized): Secondary | ICD-10-CM

## 2021-11-22 DIAGNOSIS — M546 Pain in thoracic spine: Secondary | ICD-10-CM

## 2021-11-22 DIAGNOSIS — M545 Low back pain, unspecified: Secondary | ICD-10-CM

## 2021-11-22 DIAGNOSIS — M25511 Pain in right shoulder: Secondary | ICD-10-CM | POA: Diagnosis not present

## 2021-11-22 DIAGNOSIS — G8929 Other chronic pain: Secondary | ICD-10-CM
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
## 2021-11-24 ENCOUNTER — Ambulatory Visit

## 2021-11-24 ENCOUNTER — Other Ambulatory Visit: Payer: Self-pay

## 2021-11-24 ENCOUNTER — Encounter: Admitting: Physician Assistant

## 2021-11-24 DIAGNOSIS — M546 Pain in thoracic spine: Secondary | ICD-10-CM

## 2021-11-24 DIAGNOSIS — M545 Low back pain, unspecified: Secondary | ICD-10-CM

## 2021-11-24 DIAGNOSIS — M25511 Pain in right shoulder: Secondary | ICD-10-CM | POA: Diagnosis not present

## 2021-11-24 DIAGNOSIS — M6281 Muscle weakness (generalized): Secondary | ICD-10-CM

## 2021-11-24 DIAGNOSIS — Z0289 Encounter for other administrative examinations: Secondary | ICD-10-CM

## 2021-11-24 DIAGNOSIS — G8929 Other chronic pain: Secondary | ICD-10-CM
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
## 2021-11-29 ENCOUNTER — Ambulatory Visit

## 2021-11-29 ENCOUNTER — Other Ambulatory Visit: Payer: Self-pay

## 2021-11-29 DIAGNOSIS — M25511 Pain in right shoulder: Secondary | ICD-10-CM | POA: Diagnosis not present

## 2021-11-29 DIAGNOSIS — M546 Pain in thoracic spine: Secondary | ICD-10-CM

## 2021-11-29 DIAGNOSIS — G8929 Other chronic pain: Secondary | ICD-10-CM

## 2021-11-29 DIAGNOSIS — M6281 Muscle weakness (generalized): Secondary | ICD-10-CM
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
## 2021-12-01 ENCOUNTER — Other Ambulatory Visit: Payer: Self-pay

## 2021-12-01 ENCOUNTER — Ambulatory Visit: Attending: Physician Assistant

## 2021-12-01 DIAGNOSIS — G8929 Other chronic pain: Secondary | ICD-10-CM | POA: Diagnosis present

## 2021-12-01 DIAGNOSIS — M545 Low back pain, unspecified: Secondary | ICD-10-CM | POA: Insufficient documentation

## 2021-12-01 DIAGNOSIS — M546 Pain in thoracic spine: Secondary | ICD-10-CM | POA: Diagnosis present

## 2021-12-01 DIAGNOSIS — M6281 Muscle weakness (generalized): Secondary | ICD-10-CM | POA: Diagnosis present

## 2021-12-01 DIAGNOSIS — M25511 Pain in right shoulder: Secondary | ICD-10-CM | POA: Diagnosis not present
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
## 2021-12-02 ENCOUNTER — Telehealth: Payer: Self-pay

## 2021-12-02 ENCOUNTER — Ambulatory Visit (INDEPENDENT_AMBULATORY_CARE_PROVIDER_SITE_OTHER): Admitting: Physician Assistant

## 2021-12-02 ENCOUNTER — Encounter: Payer: Self-pay | Admitting: Physician Assistant

## 2021-12-02 VITALS — BP 136/80 | HR 69 | Temp 98.3°F | Resp 16 | Ht 68.0 in | Wt 205.2 lb

## 2021-12-02 DIAGNOSIS — E669 Obesity, unspecified: Secondary | ICD-10-CM | POA: Diagnosis not present

## 2021-12-02 DIAGNOSIS — G8929 Other chronic pain: Secondary | ICD-10-CM | POA: Diagnosis not present

## 2021-12-02 DIAGNOSIS — M25511 Pain in right shoulder: Secondary | ICD-10-CM | POA: Diagnosis not present

## 2021-12-02 DIAGNOSIS — G4710 Hypersomnia, unspecified: Secondary | ICD-10-CM

## 2021-12-02 NOTE — Progress Notes (Signed)
[facility] [address] [place], [place] [address]  Internal MEDICINE  Office Visit Note  Patient Name: Evan Leach  [id number]  [id number]  Date of Service: 12/02/2021  Chief Complaint  Patient presents with   Follow-up   Depression   Gastroesophageal Reflux   Anxiety   Finger Injury    Has been having soreness on left ring finger - happened beginning of this week   Sleep Apnea    Has been having issues with sleep apnea for about a year    HPI Pt is here for routine follow up to discuss sleep -fiance has witnessed him stop breathing in his sleep, he wakes up restless. Loud snoring. Daytime fatigue. Has morning headaches, difficulty concentrating and brain fog as well. Does note dryness in sinuses since being exposed to dust in [place]. Further discussed some dryness could be due to underlying sleep apnea as well if mouth breathing and due to dry weather.  -also went to grab something earlier this week and left ring finger has been sore since, worse with movement and tender to the touch. Thinks he jammed it. Has not tried icing or any NSAIDs and will do so now. -States he needs a referral for ortho to continue PT for shoulder. He states PT is going well but that in order to continue he needs to be seen by a specialist to sign off on further therapy. He did see ortho for his hip already and will try calling to see if they can see him for his shoulder as well. A referral will be placed if it is needed.  EPWORTH SLEEPINESS SCALE:  Scale:  (0)= no chance of dozing; (1)= slight chance of dozing; (2)= moderate chance of dozing; (3)= high chance of dozing  Chance  Situtation    Sitting and reading: 3    Watching TV: 2    Sitting Inactive in public: 0    As a passenger in car: 0      Lying down to rest: 3    Sitting and talking: 1    Sitting quielty after lunch: 2    In a car, stopped in traffic: 1   TOTAL SCORE:   12 out of 24   Current Medication: Outpatient  Encounter Medications as of 12/02/2021  Medication Sig   cetirizine (ZYRTEC) 10 MG tablet Take 1 tablet (10 mg total) by mouth daily.   cyclobenzaprine (FLEXERIL) 5 MG tablet Take 1 tablet (5 mg total) by mouth at bedtime.   fluticasone (FLONASE) 50 MCG/ACT nasal spray Place 2 sprays into both nostrils in the morning and at bedtime.   hydrOXYzine (ATARAX/VISTARIL) 25 MG tablet Take by mouth.   ibuprofen (ADVIL) 600 MG tablet Take by mouth.   indomethacin (INDOCIN) 25 MG capsule Take by mouth.   omeprazole (PRILOSEC) 20 MG capsule Take by mouth.   No facility-administered encounter medications on file as of 12/02/2021.    Surgical History: Past Surgical History:  Procedure Laterality Date   WISDOM TOOTH EXTRACTION      Medical History: Past Medical History:  Diagnosis Date   Anxiety    Depression    GERD (gastroesophageal reflux disease)     Family History: Family History  Problem Relation Age of Onset   Diabetes Maternal Grandmother    Diabetes Maternal Grandfather    Diabetes Paternal Grandmother     Social History   Socioeconomic History   Marital status: Single    Spouse name: Not on file  Number of children: Not on file   Years of education: Not on file   Highest education level: Not on file  Occupational History   Not on file  Tobacco Use   Smoking status: Never   Smokeless tobacco: Never  Substance and Sexual Activity   Alcohol use: Yes    Comment: social   Drug use: Never   Sexual activity: Not on file  Other Topics Concern   Not on file  Social History Narrative   Not on file   Social Determinants of Health   Financial Resource Strain: Not on file  Food Insecurity: Not on file  Transportation Needs: Not on file  Physical Activity: Not on file  Stress: Not on file  Social Connections: Not on file  Intimate Partner Violence: Not on file      Review of Systems  Constitutional:  Positive for fatigue. Negative for chills and unexpected weight  change.  HENT:  Negative for congestion, postnasal drip, rhinorrhea, sneezing and sore throat.   Eyes:  Negative for redness.  Respiratory:  Positive for apnea. Negative for cough, chest tightness and shortness of breath.   Cardiovascular:  Negative for chest pain and palpitations.  Gastrointestinal:  Negative for abdominal pain, constipation, diarrhea, nausea and vomiting.  Genitourinary:  Positive for frequency. Negative for dysuria.  Musculoskeletal:  Positive for arthralgias and back pain. Negative for joint swelling and neck pain.  Skin:  Negative for rash.  Neurological:  Positive for numbness and headaches. Negative for tremors.  Hematological:  Negative for adenopathy. Does not bruise/bleed easily.  Psychiatric/Behavioral:  Positive for sleep disturbance. Negative for behavioral problems (Depression) and suicidal ideas. The patient is not nervous/anxious.    Vital Signs: BP 136/80    Pulse 69    Temp 98.3 F (36.8 C)    Resp 16    Ht 5\' 8"  (1.727 m)    Wt 205 lb 3.2 oz (93.1 kg)    SpO2 97%    BMI 31.20 kg/m    Physical Exam Vitals and nursing note reviewed.  Constitutional:      General: He is not in acute distress.    Appearance: He is well-developed. He is obese. He is not diaphoretic.  HENT:     Head: Normocephalic and atraumatic.     Mouth/Throat:     Pharynx: No oropharyngeal exudate.  Eyes:     Pupils: Pupils are equal, round, and reactive to light.  Neck:     Thyroid: No thyromegaly.     Vascular: No JVD.     Trachea: No tracheal deviation.  Cardiovascular:     Rate and Rhythm: Normal rate and regular rhythm.     Heart sounds: Normal heart sounds. No murmur heard.   No friction rub. No gallop.  Pulmonary:     Effort: Pulmonary effort is normal. No respiratory distress.     Breath sounds: No wheezing or rales.  Chest:     Chest wall: No tenderness.  Abdominal:     General: Bowel sounds are normal.     Palpations: Abdomen is soft.  Musculoskeletal:         General: Tenderness present. Normal range of motion.     Cervical back: Normal range of motion and neck supple.  Lymphadenopathy:     Cervical: No cervical adenopathy.  Skin:    General: Skin is warm and dry.  Neurological:     Mental Status: He is alert and oriented to person, place, and time.  Cranial Nerves: No cranial nerve deficit.  Psychiatric:        Behavior: Behavior normal.        Thought Content: Thought content normal.        Judgment: Judgment normal.       Assessment/Plan: 1. Hypersomnia Based on snoring, witnessed apneas, daytime fatigue, headaches, difficulty concentrating, brain fog, and elevated BMI will need PSG to evaluate for OSA.  - PSG SLEEP STUDY  2. Chronic right shoulder pain Patient doing well with PT, but states he needs referral to specialist in order for further PT to be signed off. He will contact ortho who is treating his hip already to see if they can also treat shoulder, if needed a new referral has been placed. - AMB referral to orthopedics  3. Obesity (BMI 30.0-34.9) Obesity Counseling: Had a lengthy discussion regarding patients BMI and weight issues. Patient was instructed on portion control as well as increased activity. Also discussed caloric restrictions with trying to maintain intake less than 2000 Kcal. Discussions were made in accordance with the 5As of weight management. Simple actions such as not eating late and if able to, taking a walk is suggested.   General Counseling: Evan Leach understanding of the findings of todays visit and agrees with plan of treatment. I have discussed any further diagnostic evaluation that may be needed or ordered today. We also reviewed his medications today. he has been encouraged to call the office with any questions or concerns that should arise related to todays visit.    Orders Placed This Encounter  Procedures   AMB referral to orthopedics   PSG SLEEP STUDY    No orders of the defined  types were placed in this encounter.   This patient was seen by Drema Dallas, PA-C in collaboration with Dr. Clayborn Bigness as a part of collaborative care agreement.   Total time spent:30 Minutes Time spent includes review of chart, medications, test results, and follow up plan with the patient.      Dr Lavera Guise Internal medicine

## 2021-12-02 NOTE — Telephone Encounter (Signed)
SS ordered. Printed. Put on Titania's desk-Toni °

## 2021-12-02 NOTE — Patient Instructions (Signed)
Hypersomnia Hypersomnia is a condition in which a person feels very tired during the day even though he or she gets plenty of sleep at night. A person with this condition may take naps during the day and may find it very difficult to wake up from sleep. Hypersomnia may affect a person's ability to think, concentrate, drive, or remember things. What are the causes? The cause of this condition may not be known. Possible causes include: Certain medicines. Sleep disorders, such as narcolepsy and sleep apnea. Injury to the head, brain, or spinal cord. Drug or alcohol use. Gastroesophageal reflux disease (GERD). Tumors. Certain medical conditions, such as depression, diabetes, or an underactive thyroid gland (hypothyroidism). What are the signs or symptoms? The main symptoms of hypersomnia include: Feeling very tired throughout the day, regardless of how much sleep you got the night before. Having trouble waking up. Others may find it difficult to wake you up when you are sleeping. Sleeping for longer and longer periods at a time. Taking naps throughout the day. Other symptoms may include: Feeling restless, anxious, or annoyed. Lacking energy. Having trouble with: Remembering. Speaking. Thinking. Loss of appetite. Seeing, hearing, tasting, smelling, or feeling things that are not real (hallucinations). How is this diagnosed? This condition may be diagnosed based on: Your symptoms and medical history. Your sleeping habits. Your health care provider may ask you to write down your sleeping habits in a daily sleep log, along with any symptoms you have. A series of tests that are done while you sleep (sleep study or polysomnogram). A test that measures how quickly you can fall asleep during the day (daytime nap study or multiple sleep latency test). How is this treated? Treatment can help you manage your condition. Treatment may include: Following a regular sleep routine. Lifestyle changes,  such as changing your eating habits, getting regular exercise, and avoiding alcohol or caffeinated beverages. Taking medicines to make you more alert (stimulants) during the day. Treating any underlying medical causes of hypersomnia. Follow these instructions at home: Sleep routine  Schedule the same bedtime and wake-up time each day. Practice a relaxing bedtime routine. This may include reading, meditation, deep breathing, or taking a warm bath before going to sleep. Get regular exercise each day. Avoid strenuous exercise in the evening hours. Keep your sleep environment at a cooler temperature, darkened, and quiet. Sleep with pillows and a mattress that are comfortable and supportive. Schedule short 20-minute naps for when you feel sleepiest during the day. Talk with your employer or teachers about your hypersomnia. If possible, adjust your schedule so that: You have a regular daytime work schedule. You can take a scheduled nap during the day. You do not have to work or be active at night. Do not eat a heavy meal for a few hours before bedtime. Eat your meals at about the same times every day. Avoid drinking alcohol or caffeinated beverages. Safety  Do not drive or use heavy machinery if you are sleepy. Ask your health care provider if it is safe for you to drive. Wear a life jacket when swimming or spending time near water. General instructions Take supplements and over-the-counter and prescription medicines only as told by your health care provider. Keep a sleep log that will help your doctor manage your condition. This may include information about: What time you go to bed each night. How often you wake up at night. How many hours you sleep at night. How often and for how long you nap during the day.   Any observations from others, such as leg movements during sleep, sleep walking, or snoring. Keep all follow-up visits as told by your health care provider. This is important. Contact  a health care provider if: You have new symptoms. Your symptoms get worse. Get help right away if: You have serious thoughts about hurting yourself or someone else. If you ever feel like you may hurt yourself or others, or have thoughts about taking your own life, get help right away. You can go to your nearest emergency department or call: Your local emergency services (911 in the [place] A suicide crisis helpline, such as the National Suicide Prevention Lifeline at 1-800-273-8255 or 988 in the [place] This is open 24 hours a day. Summary Hypersomnia refers to a condition in which you feel very tired during the day even though you get plenty of sleep at night. A person with this condition may take naps during the day and may find it very difficult to wake up from sleep. Hypersomnia may affect a person's ability to think, concentrate, drive, or remember things. Treatment, such as following a regular sleep routine and making some lifestyle changes, can help you manage your condition. This information is not intended to replace advice given to you by your health care provider. Make sure you discuss any questions you have with your health care provider. Document Revised: 05/11/2021 Document Reviewed: 08/26/2020 Elsevier Patient Education  2022 Elsevier Inc.  

## 2021-12-02 NOTE — Telephone Encounter (Signed)
Ortho referral sent via [facility] to [facility] °

## 2021-12-05 NOTE — Telephone Encounter (Signed)
Appointment> 12/06/21 @ 2:00-Toni

## 2021-12-06 ENCOUNTER — Ambulatory Visit: Admitting: Physical Therapy

## 2021-12-08 ENCOUNTER — Ambulatory Visit

## 2021-12-08 ENCOUNTER — Telehealth: Payer: Self-pay

## 2021-12-08 ENCOUNTER — Other Ambulatory Visit: Payer: Self-pay

## 2021-12-08 DIAGNOSIS — M546 Pain in thoracic spine: Secondary | ICD-10-CM

## 2021-12-08 DIAGNOSIS — M6281 Muscle weakness (generalized): Secondary | ICD-10-CM

## 2021-12-08 DIAGNOSIS — G8929 Other chronic pain: Secondary | ICD-10-CM

## 2021-12-08 DIAGNOSIS — M545 Low back pain, unspecified: Secondary | ICD-10-CM

## 2021-12-08 DIAGNOSIS — M25511 Pain in right shoulder: Secondary | ICD-10-CM | POA: Diagnosis not present

## 2021-12-08 NOTE — Telephone Encounter (Signed)
Author reached out to patient today at 10:00 AM to inform of earlier appointment times available in the afternoon.  Patient agreeable to rescheduling today's 515 and is now confirmed to come in at 3:00.  10:18 AM, 12/08/21 Rosamaria Lints, PT, DPT Physical Therapist - [place] [phone] (Office)
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
## 2021-12-09 ENCOUNTER — Encounter: Payer: Self-pay | Admitting: Emergency Medicine

## 2021-12-09 ENCOUNTER — Other Ambulatory Visit: Payer: Self-pay

## 2021-12-09 ENCOUNTER — Emergency Department

## 2021-12-09 DIAGNOSIS — M545 Low back pain, unspecified: Secondary | ICD-10-CM | POA: Diagnosis present

## 2021-12-09 DIAGNOSIS — M5417 Radiculopathy, lumbosacral region: Secondary | ICD-10-CM | POA: Diagnosis not present

## 2021-12-09 NOTE — ED Triage Notes (Signed)
Pt presents to ER from back pain, reports has been dealing with back pain for a while. Had a MRI done about 1 month ago and revealed 3 bulging disks. Has not had a follow up visit after his MRI. Reports his lower back pain increased today, pain radiates to left buttock. Pt talks in complete sentences.

## 2021-12-09 NOTE — Addendum Note (Signed)
Addended by: Etta Grandchild on: 12/09/2021 01:17 PM   Modules accepted: Orders

## 2021-12-10 ENCOUNTER — Emergency Department
Admission: EM | Admit: 2021-12-10 | Discharge: 2021-12-10 | Disposition: A | Discharge status: Home / Self Care | Attending: Emergency Medicine | Admitting: Emergency Medicine

## 2021-12-10 DIAGNOSIS — M5417 Radiculopathy, lumbosacral region: Secondary | ICD-10-CM

## 2021-12-10 MED ORDER — ONDANSETRON 4 MG PO TBDP: 4.0000 mg | ORAL_TABLET | Freq: Four times a day (QID) | ORAL | 0 refills | Status: AC | PRN

## 2021-12-10 MED ORDER — PREDNISONE 20 MG PO TABS
60.0000 mg | ORAL_TABLET | Freq: Once | ORAL | Status: AC
  Administered 2021-12-10: 60 mg via ORAL
  Filled 2021-12-10: qty 3

## 2021-12-10 MED ORDER — IBUPROFEN 800 MG PO TABS
800.0000 mg | ORAL_TABLET | Freq: Once | ORAL | Status: AC
  Administered 2021-12-10: 800 mg via ORAL
  Filled 2021-12-10: qty 1

## 2021-12-10 MED ORDER — IBUPROFEN 800 MG PO TABS: 800.0000 mg | ORAL_TABLET | Freq: Three times a day (TID) | ORAL | 0 refills | Status: DC | PRN

## 2021-12-10 MED ORDER — PREDNISONE 10 MG PO TABS: 60.0000 mg | ORAL_TABLET | Freq: Every day | ORAL | 0 refills | Status: DC

## 2021-12-10 MED ORDER — OXYCODONE-ACETAMINOPHEN 5-325 MG PO TABS: 2.0000 | ORAL_TABLET | Freq: Four times a day (QID) | ORAL | 0 refills | Status: AC | PRN

## 2021-12-10 NOTE — Discharge Instructions (Signed)

## 2021-12-10 NOTE — ED Provider Notes (Signed)
[facility] Provider Note    Event Date/Time   First MD Initiated Contact with Patient 12/10/21 [phone]     (approximate)   History   Back Pain   HPI  Evan Leach is a 21 y.o. male with history of back pain for the past 4 years intermittently who presents to the emergency department with increased lower back pain over the past few days.  Will have intermittent numbness and tingling in the right inner thigh but no numbness currently.  Denies any weakness.  No bowel or bladder incontinence.  No urinary retention.  No fever.  No previous back surgeries or epidural injections.  No history of cancer, diabetes, HIV.  States he is seeing a physical therapist and had needling 2 days ago without much relief.  He has a primary care doctor for follow-up.  States he thinks symptoms started after carrying a 90 pound pack while [place] [employer].  He did have an MRI of his lumbar spine on 10/19/2021 ordered by his PCP.  He has not seen a neurosurgeon.    History provided by patient.    Past Medical History:  Diagnosis Date   Anxiety    Depression    GERD (gastroesophageal reflux disease)     Past Surgical History:  Procedure Laterality Date   WISDOM TOOTH EXTRACTION      MEDICATIONS:  Prior to Admission medications   Medication Sig Start Date End Date Taking? Authorizing Provider  cetirizine (ZYRTEC) 10 MG tablet Take 1 tablet (10 mg total) by mouth daily. 03/08/20   Johnna Acosta, NP  cyclobenzaprine (FLEXERIL) 5 MG tablet Take 1 tablet (5 mg total) by mouth at bedtime. 09/16/21   McDonough, Salomon Fick, PA-C  fluticasone (FLONASE) 50 MCG/ACT nasal spray Place 2 sprays into both nostrils in the morning and at bedtime. 03/08/20   Johnna Acosta, NP  hydrOXYzine (ATARAX/VISTARIL) 25 MG tablet Take by mouth. 02/17/21   [provider]  ibuprofen (ADVIL) 600 MG tablet Take by mouth. 09/01/21   [provider]  indomethacin (INDOCIN) 25 MG  capsule Take by mouth. 11/08/20   [provider]  omeprazole (PRILOSEC) 20 MG capsule Take by mouth. 06/09/21   [provider]    Physical Exam   Triage Vital Signs: ED Triage Vitals  Enc Vitals Group     BP 12/09/21 2236 128/88     Pulse Rate 12/09/21 2236 71     Resp 12/09/21 2236 20     Temp 12/09/21 2236 (!) 97 F (36.1 C)     Temp Source 12/09/21 2236 Oral     SpO2 12/09/21 2236 96 %     Weight 12/09/21 2237 205 lb (93 kg)     Height 12/09/21 2237 5\' 8"  (1.727 m)     Head Circumference --      Peak Flow --      Pain Score 12/09/21 2236 6     Pain Loc --      Pain Edu? --      Excl. in GC? --     Most recent vital signs: Vitals:   12/09/21 2236  BP: 128/88  Pulse: 71  Resp: 20  Temp: (!) 97 F (36.1 C)  SpO2: 96%     CONSTITUTIONAL: Alert and responds appropriately to questions. Well-appearing; well-nourished HEAD: Normocephalic, atraumatic EYES: Conjunctivae clear, pupils appear equal ENT: normal nose; moist mucous membranes NECK: Normal range of motion CARD: Regular rate and  rhythm, heart sounds normal RESP: Normal chest excursion without splinting or tachypnea; no hypoxia or respiratory distress, speaking full sentences, lungs clear to auscultation bilaterally ABD/GI: non-distended BACK: No midline spinal tenderness, step-off or deformity EXT: Normal ROM in all joints, no major deformities noted SKIN: Normal color for age and race, no rashes on exposed skin NEURO: Moves all extremities equally, normal speech, no facial asymmetry noted, strength 5/5 in bilateral lower extremities, no hyperreflexia or clonus, no saddle anesthesia, normal gait PSYCH: The patient's mood and manner are appropriate. Grooming and personal hygiene are appropriate.  ED Results / Procedures / Treatments   LABS: (all labs ordered are listed, but only abnormal results are displayed) Labs Reviewed - No data to display   EKG:  EKG Interpretation  Date/Time:     Ventricular Rate:    PR Interval:    QRS Duration:   QT Interval:    QTC Calculation:   R Axis:     Text Interpretation:            RADIOLOGY: My personal review and interpretation of imaging: X-ray of the lumbar spine is unremarkable.  I have personally reviewed all radiology reports. DG Lumbar Spine Complete  Result Date: 12/09/2021 CLINICAL DATA:  Persistent back pain. EXAM: LUMBAR SPINE - COMPLETE 4+ VIEW COMPARISON:  MRI 10/19/2021 FINDINGS: Five lumbar type vertebral bodies show normal alignment. No evidence of disc space narrowing by radiography. No facet arthropathy or pars defect. No focal bone lesion. IMPRESSION: Normal lumbar radiographs. Electronically Signed   By: Paulina Fusi M.D.   On: 12/09/2021 23:15     PROCEDURES:  Critical Care performed: No   CRITICAL CARE Performed by: Baxter Hire Ani Deoliveira   Total critical care time: 0 minutes  Critical care time was exclusive of separately billable procedures and treating other patients.  Critical care was necessary to treat or prevent imminent or life-threatening deterioration.  Critical care was time spent personally by me on the following activities: development of treatment plan with patient and/or surrogate as well as nursing, discussions with consultants, evaluation of patient's response to treatment, examination of patient, obtaining history from patient or surrogate, ordering and performing treatments and interventions, ordering and review of laboratory studies, ordering and review of radiographic studies, pulse oximetry and re-evaluation of patient's condition.   Procedures    IMPRESSION / MDM / ASSESSMENT AND PLAN / ED COURSE  I reviewed the triage vital signs and the nursing notes.   Patient with low back pain for the past several days.  No neurologic deficits.     DIFFERENTIAL DIAGNOSIS (includes but not limited to):   Radiculopathy, muscle strain, muscle spasm.  Doubt cauda equina, epidural abscess  hematoma, discitis osteomyelitis, transverse myelitis, spinal stenosis, fracture   PLAN: X-rays obtained from triage reviewed by myself and radiologist and showed no acute abnormality.  Will provide with prednisone, ibuprofen from the emergency department.  He drove himself to the emergency department and would like to drive home.  Discussed that I cannot give him any sedating medications in the emergency department given that he is driving home but would be happy to discharge him with a prescription of Percocet for pain control.  Also recommended ibuprofen, steroid taper and close follow-up with his PCP.  He has no red flag symptoms to suggest a neurologic, neurosurgical emergency today.  Discussed at length return precautions.  He is comfortable with this plan.  Discussed supportive care instructions.   At this time, I do not feel there is  any life-threatening condition present. I reviewed all nursing notes, vitals, pertinent previous records.  All lab and urine results, EKGs, imaging ordered have been independently reviewed and interpreted by myself.  I reviewed all available radiology reports from any imaging ordered this visit.  Based on my assessment, I feel the patient is safe to be discharged home without further emergent workup and can continue workup as an outpatient as needed. Discussed all findings, treatment plan as well as usual and customary return precautions with patient.  They verbalize understanding and are comfortable with this plan.  Outpatient follow-up has been provided as needed.  All questions have been answered.   MEDICATIONS GIVEN IN ED: Medications  ibuprofen (ADVIL) tablet 800 mg (has no administration in time range)  predniSONE (DELTASONE) tablet 60 mg (has no administration in time range)        CONSULTS: No emergent neurosurgical consultation needed given neurologically intact with no red flag symptoms.   OUTSIDE RECORDS REVIEWED: Reviewed patient's previous  neurosurgical note with Dr. Marcell Barlow on 12/08/2021.  Patient was going to be referred to physiatry.  Documented that there is no indication for surgical intervention.         FINAL CLINICAL IMPRESSION(S) / ED DIAGNOSES   Final diagnoses:  Lumbosacral radiculopathy     Rx / DC Orders   ED Discharge Orders          Ordered    ibuprofen (ADVIL) 800 MG tablet  Every 8 hours PRN        12/10/21 0255    oxyCODONE-acetaminophen (PERCOCET) 5-325 MG tablet  Every 6 hours PRN        12/10/21 0255    ondansetron (ZOFRAN-ODT) 4 MG disintegrating tablet  Every 6 hours PRN        12/10/21 0255    predniSONE (DELTASONE) 10 MG tablet  Daily        12/10/21 0255             Note:  This document was prepared using Dragon voice recognition software and may include unintentional dictation errors.   Gaye Scorza, Layla Maw, DO 12/10/21 [id number]

## 2021-12-13 ENCOUNTER — Ambulatory Visit: Admitting: Physical Therapy

## 2021-12-13 ENCOUNTER — Encounter (INDEPENDENT_AMBULATORY_CARE_PROVIDER_SITE_OTHER): Admitting: Internal Medicine

## 2021-12-13 DIAGNOSIS — G4719 Other hypersomnia: Secondary | ICD-10-CM

## 2021-12-15 ENCOUNTER — Ambulatory Visit: Admitting: Physical Therapy

## 2021-12-15 ENCOUNTER — Ambulatory Visit: Admitting: Physician Assistant

## 2021-12-15 ENCOUNTER — Telehealth: Payer: Self-pay

## 2021-12-15 NOTE — Telephone Encounter (Signed)
Patient scheduled for psg sleep study on 12/13/21 @ [facility] tat

## 2021-12-19 ENCOUNTER — Encounter (INDEPENDENT_AMBULATORY_CARE_PROVIDER_SITE_OTHER): Admitting: Internal Medicine

## 2021-12-19 DIAGNOSIS — G4733 Obstructive sleep apnea (adult) (pediatric): Secondary | ICD-10-CM

## 2021-12-20 ENCOUNTER — Ambulatory Visit

## 2021-12-20 ENCOUNTER — Other Ambulatory Visit: Payer: Self-pay

## 2021-12-20 DIAGNOSIS — M545 Low back pain, unspecified: Secondary | ICD-10-CM

## 2021-12-20 DIAGNOSIS — G8929 Other chronic pain: Secondary | ICD-10-CM

## 2021-12-20 DIAGNOSIS — M25511 Pain in right shoulder: Secondary | ICD-10-CM

## 2021-12-20 DIAGNOSIS — M6281 Muscle weakness (generalized): Secondary | ICD-10-CM

## 2021-12-20 DIAGNOSIS — M546 Pain in thoracic spine: Secondary | ICD-10-CM
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
## 2021-12-22 ENCOUNTER — Other Ambulatory Visit: Payer: Self-pay

## 2021-12-22 ENCOUNTER — Ambulatory Visit

## 2021-12-22 DIAGNOSIS — M25511 Pain in right shoulder: Secondary | ICD-10-CM | POA: Diagnosis not present

## 2021-12-22 DIAGNOSIS — M6281 Muscle weakness (generalized): Secondary | ICD-10-CM

## 2021-12-22 DIAGNOSIS — M546 Pain in thoracic spine: Secondary | ICD-10-CM

## 2021-12-22 DIAGNOSIS — G8929 Other chronic pain: Secondary | ICD-10-CM
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
## 2021-12-27 ENCOUNTER — Ambulatory Visit: Admitting: Physical Therapy

## 2021-12-27 NOTE — Procedures (Signed)
[facility]  Polysomnogram Report Part I  Phone: [phone] Fax: [phone]  Patient Name: Evan Leach, Evan Leach. Acquisition Number: [id number]  Date of Birth: 07-Jan-2001 Acquisition Date: 12/19/2021  Referring Physician: Lynn Ito PA-C     History: The patient is a 21 year old male with obstructive sleep apnea for CPAP titration. Medical History: depression,GERD,anxiety  Medications: Zyrtec, Flexeril, Flonase, Atarax, Advil, indocin, Prilosec.  Procedure: This routine overnight polysomnogram was performed on the Alice 5 using the standard CPAP protocol. This included 6 channels of EEG, 2 channels of EOG, chin EMG, bilateral anterior tibialis EMG, nasal/oral thermistor, PTAF (nasal pressure transducer), chest and abdominal wall movements, EKG, and pulse oximetry.  Description: The total recording time was 486.5 minutes. The total sleep time was 433.0 minutes. There were a total of 49.0 minutes of wakefulness after sleep onset for a slightly reducedsleep efficiency of 89.0%. The latency to sleep onset was short at 4.5 minutes. The R sleep onset latency was prolonged at 243.0 minutes. Sleep parameters, as a percentage of the total sleep time, demonstrated 4.3% of sleep was in N1 sleep, 70.1% N2, 4.3% N3 and 21.4% R sleep. There were a total of 80 arousals for an arousal index of 11.1 arousals per hour of sleep that was within normal limits.  Overall, there were a total of 15 respiratory events for a respiratory disturbance index, which includes apneas, hypopneas and RERAs (increased respiratory effort) of 2.1 respiratory events per hour of sleep during the pressure titration. CPAP was initiated at 5 cm H2O at lights out, 9:29 p.m. It was titrated in 1 cm increments for intermittent hypopneas to the final pressure of  7 cm H2O. No respiratory events were observed at the final pressure and REM sleep was observed. Virtually all sleep was in the supine position.  Additionally, the  baseline oxygen saturation during wakefulness was 98%, during NREM sleep averaged 97%, and during REM sleep averaged 97%. The total duration of oxygen < 90% was 0.0 minutes.  Cardiac monitoring-  There were no significant cardiac rhythm irregularities.   Periodic limb movement monitoring- did not demonstrate periodic limb movements.   Impression: This patient's obstructive sleep apnea demonstrated significant improvement with the utilization of nasal CPAP at 7 cm H2O. Note: the part 2 report does not accurately reflect the CPAP pressures utilized.     Recommendations: Would recommend utilization of nasal CPAP at 7 cm H2O.    An F20 large mask, was used. Chin strap used during study- no. Humidifier used during study- yes.     Yevonne Pax, MD, [facility] Diplomate ABMS-Pulmonary, Critical Care and Sleep Medicine  Electronically reviewed and digitally signed  [facility] CPAP/BIPAP Polysomnogram Report Part II Phone: [phone] Fax: [phone]  Patient last name Evan Leach Neck Size 16.0 in. Acquisition [id number]  Patient first name Evan Leach. Weight 206.0 lbs. Started 12/19/2021 at 9:25:15 PM  Birth date Nov 01, 2000 Height 69.0 in. Stopped 12/20/2021 at 5:38:39 AM  Age 60      Type Adult BMI 30.4 lb/in2 Duration 486.5  Hampton Abbot RPSGT/ Vonda Antigua Reviewed by: Valentino Hue. Henke, PhD, ABSM, FAASM Sleep Data: Lights Out: 9:29:15 PM Sleep Onset: 9:33:45 PM  Lights On: 5:35:45 AM Sleep Efficiency: 89.0 %  Total Recording Time: 486.5 min Sleep Latency (from Lights Off) 4.5 min  Total Sleep Time (TST): 433.0 min R Latency (from Sleep Onset): 243.0 min  Sleep Period Time: 482.0 min Total number of awakenings: 26  Wake during sleep: 49.0 min Wake After Sleep Onset (WASO): 49.0 min   Sleep Data:         Arousal Summary: Stage  Latency from lights out (min) Latency from sleep onset (min) Duration (min) % Total Sleep Time  Normal values  N 1 4.5 0.0 18.5 4.3 (5%)  N 2 6.0 1.5  303.5 70.1 (50%)  N 3 119.0 114.5 18.5 4.3 (20%)  R 247.5 243.0 92.5 21.4 (25%)    Number Index  Spontaneous 73 10.1  Apneas & Hypopneas 8 1.1  RERAs 0 0.0       (Apneas & Hypopneas & RERAs)  (8) (1.1)  Limb Movement 0 0.0  Snore 0 0.0  TOTAL 81 11.2     Respiratory Data:  CA OA MA Apnea Hypopnea* A+ H RERA Total  Number 0 0 0 0 15 15 0 15  Mean Dur (sec) 0.0 0.0 0.0 0.0 26.5 26.5 0.0 26.5  Max Dur (sec) 0.0 0.0 0.0 0.0 40.0 40.0 0.0 40.0  Total Dur (min) 0.0 0.0 0.0 0.0 6.6 6.6 0.0 6.6  % of TST 0.0 0.0 0.0 0.0 1.5 1.5 0.0 1.5  Index (#/h TST) 0.0 0.0 0.0 0.0 2.1 2.1 0.0 2.1  *Hypopneas scored based on 4% or greater desaturation.  Sleep Stage:         REM NREM TST  AHI 1.9 2.1 2.1  RDI 1.9 2.1 2.1    Sleep (min) TST (%) REM (min) NREM (min) CA (#) OA (#) MA (#) HYP (#) AHI (#/h) RERA (#) RDI (#/h) Desat (#)  Supine 417.0 96.30 92.5 324.5 0 0 0 14 2.0 0 2.0 44  Non-Supine 16.00 3.70 0.00 16.00 0.00 0.00 0.00 1.00 3.75 0 3.75 6.00  Left: 16.0 3.70 0.0 16.0 0 0 0 1 3.8 0 3.8 6     Snoring: Total number of snoring episodes  0  Total time with snoring    min (   % of sleep)   Oximetry Distribution:             WK REM NREM TOTAL  Average (%)   98 97 97 97  < 90% 0.0 0.0 0.0 0.0  < 80% 0.0 0.0 0.0 0.0  < 70% 0.0 0.0 0.0 0.0  # of Desaturations* 5 6 39 50  Desat Index (#/hour) 5.6 3.9 6.9 6.9  Desat Max (%) 7 4 7 7   Desat Max Dur (sec) 57.0 68.0 76.0 76.0  Approx Min O2 during sleep 89  Approx min O2 during a respiratory event 92  Was Oxygen added (Y/N) and final rate No:   0 LPM  *Desaturations based on 3% or greater drop from baseline.   Cheyne Stokes Breathing: None Present    Heart Rate Summary:  Average Heart Rate During Sleep 63.4 bpm      Highest Heart Rate During Sleep (95th %) 77.0 bpm      Highest Heart Rate During Sleep 150 bpm (artifact)  Highest Heart Rate During Recording (TIB) 179 bpm (artifact)   Heart Rate Observations: Event  Type # Events   Bradycardia 0 Lowest HR Scored: N/A  Sinus Tachycardia During Sleep 0 Highest HR Scored: N/A  Narrow Complex Tachycardia 0 Highest HR Scored: N/A  Wide Complex Tachycardia 0 Highest HR Scored: N/A  Asystole 0 Longest Pause: N/A  Atrial Fibrillation 0 Duration Longest Event: N/A  Other Arrythmias  No Type:   Periodic Limb Movement Data: (Primary legs unless otherwise noted) Total # Limb  Movement 0 Limb Movement Index 0.0  Total # PLMS    PLMS Index     Total # PLMS Arousals    PLMS Arousal Index     Percentage Sleep Time with PLMS   min (   % sleep)  Mean Duration limb movements (secs)       IPAP Level (cmH2O) EPAP Level (cmH2O) Total Duration (min) Sleep Duration (min) Sleep (%) REM (%) CA  #) OA # MA # HYP #) AHI (#/hr) RERAs # RERAs (#/hr) RDI (#/hr)  5 5 22.1 13.1 59.3 0.0 0 0 0 1 4.6 0 0.0 4.6

## 2021-12-27 NOTE — Procedures (Signed)
[facility]  Polysomnogram Report Part I                                                                 Phone: [phone] Fax: [phone]  Patient Name: Evan Leach, Evan Leach. Acquisition Number: [id number]  Date of Birth: 2001/04/26 Acquisition Date: 12/13/2021  Referring Physician: Lynn Ito PA-C     History: The patient is a 21 year old male who was referred for evaluation of possible sleep apnea. Medical History: depression, GERD, anxiety.  Medications: Zyrtec, Flexeril, Flonase, Atarax,Advil,Indocin,Prilosec.  Procedure: This routine overnight polysomnogram was performed on the [facility] 5 using the standard diagnostic protocol. This included 6 channels of EEG, 2 channels of EOG, chin EMG, bilateral anterior tibialis EMG, nasal/oral thermistor, PTAF (nasal pressure transducer), chest and abdominal wall movements, EKG, and pulse oximetry.  Description: The total recording time was 447.5 minutes. The total sleep time was 420.5 minutes. There were a total of 25.5 minutes of wakefulness after sleep onset for a goodsleep efficiency of 94.0%. The latency to sleep onset was short at 1.5 minutes. The R sleep onset latency was prolonged at 411.0 minutes. Sleep parameters, as a percentage of the total sleep time, demonstrated 1.5% of sleep was in N1 sleep, 87.0% N2, 3.9% N3 and 7.5% R sleep. There were a total of 148 arousals for an arousal index of 21.1 arousals per hour of sleep that was elevated.  Respiratory monitoring demonstrated occasional mild to moderate degree of snoring in all positions. There were 62 apneas and hypopneas for an Apnea Hypopnea Index of 8.8 apneas and hypopneas per hour of sleep. The REM related apnea hypopnea index was 5.7/hr of REM sleep compared to a NREM AHI of 9.1/hr. The Respiratory Disturbance Index, which includes 47 respiratory effort related arousals (RERAs), was 15.6 respiratory events per hour of sleep.  The average duration of the respiratory events  was 23.3 seconds with a maximum duration of 66.5 seconds. The respiratory events occurred exclusively in the supine position with an AHI of 10.0. The respiratory events were associated with peripheral oxygen desaturations on the average to 93%. The lowest oxygen desaturation associated with a respiratory event was 90%. Additionally, the baseline oxygen saturation during wakefulness was 97%, during NREM sleep averaged 95%, and during REM sleep averaged  96%. The total duration of oxygen < 90% was 0.0 minutes.  Cardiac monitoring- did not demonstrate transient cardiac decelerations associated with the apneas. There were no significant cardiac rhythm irregularities.   Periodic limb movement monitoring- did not demonstrate periodic limb movements.     Impression: This routine overnight polysomnogram demonstrated significant, position-dependent obstructive sleep apnea with an overall Apnea Hypopnea Index of 8.8 apneas and hypopneas per hour of sleep. The respiratory events occurred exclusively in the supine position with an AHI of 10.0. The lowest desaturation was  to 90%.    Sleep efficiency was good despite the fact that the patient believed he slept only 1.5 hours. There was an elevated arousal index with reduced percentages of REM and slow wave sleep. These findings would appear to be due to the obstructive sleep apnea.  Recommendations:    A CPAP titration would be recommended for the sleep apnea. Would recommend weight loss in a patient with a BMI of  30.4.  Alternative treatment options may include an oral appliance, a nasal resistance device,  or ENT surgery in the appropriate clinical context.     Yevonne Pax, MD, [facility] Diplomate ABMS-Pulmonary, Critical Care and Sleep Medicine  Electronically reviewed and digitally signed   [facility] Polysomnogram Report Part II  Phone: [phone] Fax: [phone]  Patient last name Staver Neck Size 16.0 in. Acquisition [id number]   Patient first name Evan Leach. Weight 206.0 lbs. Started 12/13/2021 at 10:11:09 PM  Birth date 09-28-2001 Height 69.0 in. Stopped 12/14/2021 at 5:46:27 AM  Age 8 BMI 30.4 lb/in2 Duration 447.5  Study Type Adult      Sleep Report generated by Shon Hale RPSGT/ Vonda Antigua Reviewed by: Valentino Hue. Henke, PhD, ABSM, FAASM Sleep Data: Lights Out: 10:16:39 PM Sleep Onset: 10:18:09 PM  Lights On: 5:44:09 AM Sleep Efficiency: 94.0 %  Total Recording Time: 447.5 min Sleep Latency (from Lights Off) 1.5 min  Total Sleep Time (TST): 420.5 min R Latency (from Sleep Onset): 411.0 min  Sleep Period Time: 445.5 min Total number of awakenings: 9  Wake during sleep: 25.0 min Wake After Sleep Onset (WASO): 25.5 min   Sleep Data:         Arousal Summary: Stage  Latency from lights out (min) Latency from sleep onset (min) Duration (min) % Total Sleep Time  Normal values  N 1 1.5 0.0 6.5 1.5 (5%)  N 2 2.0 0.5 366.0 87.0 (50%)  N 3 60.5 59.0 16.5 3.9 (20%)  R 412.5 411.0 31.5 7.5 (25%)    Number Index  Spontaneous 100 14.3  Apneas & Hypopneas 39 5.6  RERAs 47 6.7       (Apneas & Hypopneas & RERAs)  (86) (12.3)  Limb Movement 0 0.0  Snore 0 0.0  TOTAL 186 26.5     Respiratory Data:  CA OA MA Apnea Hypopnea* A+ H RERA Total  Number 2 0 0 2 60 62 47 109  Mean Dur (sec) 12.3 0.0 0.0 12.3 24.4 24.0 22.3 23.3  Max Dur (sec) 13.0 0.0 0.0 13.0 66.5 66.5 53.0 66.5  Total Dur (min) 0.4 0.0 0.0 0.4 24.4 24.9 17.5 42.4  % of TST 0.1 0.0 0.0 0.1 5.8 5.9 4.2 10.1  Index (#/h TST) 0.3 0.0 0.0 0.3 8.6 8.8 6.7 15.6  *Hypopneas scored based on 4% or greater desaturation.  Sleep Stage:        REM NREM TST  AHI 5.7 9.1 8.8  RDI 5.7 16.3 15.6           Body Position Data:  Sleep (min) TST (%) REM (min) NREM (min) CA (#) OA (#) MA (#) HYP (#) AHI (#/h) RERA (#) RDI (#/h) Desat (#)  Supine 372.2 88.51 31.5 340.7 2 0 0 60 10.0 46 17.4 123  Non-Supine 48.30 11.49 0.00 48.30 0.00 0.00  0.00 0.00 0.00 1.00 1.24 5.00  Left: 48.3 11.49 0.0 48.3 0 0 0 0 0.0 1 1.2 5     Snoring: Total number of snoring episodes  0  Total time with snoring    min (   % of sleep)   Oximetry Distribution:             WK REM NREM TOTAL  Average (%)   97 96 95 96  < 90% 0.0 0.0 0.0 0.0  < 80% 0.0 0.0 0.0 0.0  < 70% 0.0 0.0 0.0 0.0  # of Desaturations* 3 5 120 128  Desat Index (#/hour) 6.7 9.5 18.5 18.3  Desat Max (%) 4 8 8 8   Desat Max Dur (sec) 119.0 57.0 65.0 119.0  Approx Min O2 during sleep 90  Approx min O2 during a respiratory event 90  Was Oxygen added (Y/N) and final rate No:   0 LPM  *Desaturations based on 3% or greater drop from baseline.   Cheyne Stokes Breathing: None Present   Heart Rate Summary:  Average Heart Rate During Sleep 62.3 bpm      Highest Heart Rate During Sleep (95th %) 75.0 bpm      Highest Heart Rate During Sleep 199 bpm (artifact)  Highest Heart Rate During Recording (TIB) 199 bpm (artifact)   Heart Rate Observations: Event Type # Events   Bradycardia 0 Lowest HR Scored: N/A  Sinus Tachycardia During Sleep 0 Highest HR Scored: N/A  Narrow Complex Tachycardia 0 Highest HR Scored: N/A  Wide Complex Tachycardia 0 Highest HR Scored: N/A  Asystole 0 Longest Pause: N/A  Atrial Fibrillation 0 Duration Longest Event: N/A  Other Arrythmias  No Type:    Periodic Limb Movement Data: (Primary legs unless otherwise noted) Total # Limb Movement 0 Limb Movement Index 0.0  Total # PLMS    PLMS Index     Total # PLMS Arousals    PLMS Arousal Index     Percentage Sleep Time with PLMS   min (   % sleep)  Mean Duration limb movements (secs)

## 2021-12-28 ENCOUNTER — Telehealth: Payer: Self-pay

## 2021-12-28 ENCOUNTER — Encounter: Payer: Self-pay | Admitting: Physician Assistant

## 2021-12-28 NOTE — Telephone Encounter (Signed)
Patient scheduled for CPAP titration on 12/19/21 @ [facility] ?

## 2021-12-29 ENCOUNTER — Encounter: Payer: Self-pay | Admitting: Physician Assistant

## 2021-12-29 ENCOUNTER — Ambulatory Visit: Admitting: Physical Therapy

## 2021-12-29 ENCOUNTER — Telehealth (INDEPENDENT_AMBULATORY_CARE_PROVIDER_SITE_OTHER): Admitting: Physician Assistant

## 2021-12-29 VITALS — Ht 69.0 in | Wt 203.0 lb

## 2021-12-29 DIAGNOSIS — M5442 Lumbago with sciatica, left side: Secondary | ICD-10-CM

## 2021-12-29 DIAGNOSIS — G4733 Obstructive sleep apnea (adult) (pediatric): Secondary | ICD-10-CM | POA: Diagnosis not present

## 2021-12-29 DIAGNOSIS — G8929 Other chronic pain: Secondary | ICD-10-CM

## 2021-12-29 DIAGNOSIS — M5441 Lumbago with sciatica, right side: Secondary | ICD-10-CM

## 2021-12-29 NOTE — Patient Instructions (Signed)
Living With Sleep Apnea Sleep apnea is a condition in which breathing pauses or becomes shallow during sleep. Sleep apnea is most commonly caused by a collapsed or blocked airway. People with sleep apnea usually snore loudly. They may have times when they gasp and stop breathing for 10 seconds or more during sleep. This may happen many times during the night. The breaks in breathing also interrupt the deep sleep that you need to feel rested. Even if you do not completely wake up from the gaps in breathing, your sleep may not be restful and you feel tired during the day. You may also have a headache in the morning and low energy during the day, and you may feel anxious or depressed. How can sleep apnea affect me? Sleep apnea increases your chances of extreme tiredness during the day (daytime fatigue). It can also increase your risk for health conditions, such as: Heart attack. Stroke. Obesity. Type 2 diabetes. Heart failure. Irregular heartbeat. High blood pressure. If you have daytime fatigue as a result of sleep apnea, you may be more likely to: Perform poorly at school or work. Fall asleep while driving. Have difficulty with attention. Develop depression or anxiety. Have sexual dysfunction. What actions can I take to manage sleep apnea? Sleep apnea treatment  If you were given a device to open your airway while you sleep, use it only as told by your health care provider. You may be given: An oral appliance. This is a custom-made mouthpiece that shifts your lower jaw forward. A continuous positive airway pressure (CPAP) device. This device blows air through a mask when you breathe out (exhale). A nasal expiratory positive airway pressure (EPAP) device. This device has valves that you put into each nostril. A bi-level positive airway pressure (BIPAP) device. This device blows air through a mask when you breathe in (inhale) and breathe out (exhale). You may need surgery if other treatments  do not work for you. Sleep habits Go to sleep and wake up at the same time every day. This helps set your internal clock (circadian rhythm) for sleeping. If you stay up later than usual, such as on weekends, try to get up in the morning within 2 hours of your normal wake time. Try to get at least 7-9 hours of sleep each night. Stop using a computer, tablet, and mobile phone a few hours before bedtime. Do not take long naps during the day. If you nap, limit it to 30 minutes. Have a relaxing bedtime routine. Reading or listening to music may relax you and help you sleep. Use your bedroom only for sleep. Keep your television and computer out of your bedroom. Keep your bedroom cool, dark, and quiet. Use a supportive mattress and pillows. Follow your health care provider's instructions for other changes to sleep habits. Nutrition Do not eat heavy meals in the evening. Do not have caffeine in the later part of the day. The effects of caffeine can last for more than 5 hours. Follow your health care provider's or dietitian's instructions for any diet changes. Lifestyle   Do not drink alcohol before bedtime. Alcohol can cause you to fall asleep at first, but then it can cause you to wake up in the middle of the night and have trouble getting back to sleep. Do not use any products that contain nicotine or tobacco. These products include cigarettes, chewing tobacco, and vaping devices, such as e-cigarettes. If you need help quitting, ask your health care provider. Medicines Take over-the-counter and   prescription medicines only as told by your health care provider. Do not use over-the-counter sleep medicine. You can become dependent on this medicine, and it can make sleep apnea worse. Do not use medicines, such as sedatives and narcotics, unless told by your health care provider. Activity Exercise on most days, but avoid exercising in the evening. Exercising near bedtime can interfere with  sleeping. If possible, spend time outside every day. Natural light helps regulate your circadian rhythm. General information Lose weight if you need to, and maintain a healthy weight. Keep all follow-up visits. This is important. If you are having surgery, make sure to tell your health care provider that you have sleep apnea. You may need to bring your device with you. Where to find more information Learn more about sleep apnea and daytime fatigue from: American Sleep Association: sleepassociation.org National Sleep Foundation: sleepfoundation.org National Heart, Lung, and Blood Institute: nhlbi.nih.gov Summary Sleep apnea is a condition in which breathing pauses or becomes shallow during sleep. Sleep apnea can cause daytime fatigue and other serious health conditions. You may need to wear a device while sleeping to help keep your airway open. If you are having surgery, make sure to tell your health care provider that you have sleep apnea. You may need to bring your device with you. Making changes to sleep habits, diet, lifestyle, and activity can help you manage sleep apnea. This information is not intended to replace advice given to you by your health care provider. Make sure you discuss any questions you have with your health care provider. Document Revised: 05/25/2021 Document Reviewed: 09/24/2020 Elsevier Patient Education  2022 Elsevier Inc.  

## 2021-12-29 NOTE — Progress Notes (Signed)
[facility] ?[address] ?[place], [place] [address] ? ?Internal MEDICINE  ?Telephone Visit ? ?Patient Name: Evan Leach ? [id number]  ?[id number] ? ?Date of Service: 12/29/2021 ? ?I connected with the patient at 3:14 by telephone and verified the patients identity using two identifiers.   ?I discussed the limitations, risks, security and privacy concerns of performing an evaluation and management service by telephone and the availability of in person appointments. I also discussed with the patient that there may be a patient responsible charge related to the service.  The patient expressed understanding and agrees to proceed.   ? ?Chief Complaint  ?Patient presents with  ? Telephone Assessment  ?  [id number]  ? Telephone Screen  ?  Sleep study  ? ? ?HPI ?Pt is here for a virtual visit to discuss sleep study results. ?-He is unable to come in to the office today due to [employer] obligations ?-his PSG did show overall mild OSA with an AHI of 8.8. He had a titration recommending CPAP at 7cm h2O. 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 He did go to the ED for this and has been taking ibuprofen which helps some but not much. He does not want to take any narcotics given his current training environment. He plans to start PT for his back and hip once he is back from training ? ?Current Medication: ?Outpatient Encounter Medications as of 12/29/2021  ?Medication Sig  ? cetirizine (ZYRTEC) 10 MG tablet Take 1 tablet (10 mg total) by mouth daily.  ? cyclobenzaprine (FLEXERIL) 5 MG tablet Take 1 tablet (5 mg total) by mouth at bedtime.  ? fluticasone (FLONASE) 50 MCG/ACT nasal spray Place 2 sprays into both  nostrils in the morning and at bedtime.  ? hydrOXYzine (ATARAX/VISTARIL) 25 MG tablet Take by mouth.  ? ibuprofen (ADVIL) 800 MG tablet Take 1 tablet (800 mg total) by mouth every 8 (eight) hours as needed for mild pain.  ? indomethacin (INDOCIN) 25 MG capsule Take by mouth.  ? omeprazole (PRILOSEC) 20 MG capsule Take by mouth.  ? ondansetron (ZOFRAN-ODT) 4 MG disintegrating tablet Take 1 tablet (4 mg total) by mouth every 6 (six) hours as needed for nausea or vomiting.  ? oxyCODONE-acetaminophen (PERCOCET) 5-325 MG tablet Take 2 tablets by mouth every 6 (six) hours as needed for severe pain.  ? predniSONE (DELTASONE) 10 MG tablet Take 6 tablets (60 mg total) by mouth daily. Take 60 mg (6 tablets) x 2 days, then 50 mg (5 tablets) x 2 days, then 40 mg (4 tablets) x 2 days, then 30 mg (3 tablets) x 2 days, then 20 mg (2 tablets) x 2 days, then 10 mg (1 tablet) x 2 days then STOP  ? ?No facility-administered encounter medications on file as of 12/29/2021.  ? ? ?Surgical History: ?Past Surgical History:  ?Procedure Laterality Date  ? WISDOM TOOTH EXTRACTION    ? ? ?Medical History: ?Past Medical History:  ?Diagnosis Date  ? Anxiety   ? Depression   ? GERD (gastroesophageal reflux disease)   ? ? ?Family History: ?Family History  ?Problem Relation Age of Onset  ? Diabetes Maternal  Grandmother   ? Diabetes Maternal Grandfather   ? Diabetes Paternal Grandmother   ? ? ?Social History  ? ?Socioeconomic History  ? Marital status: Single  ?  Spouse name: Not on file  ? Number of children: Not on file  ? Years of education: Not on file  ? Highest education level: Not on file  ?Occupational History  ? Not on file  ?Tobacco Use  ? Smoking status: Never  ? Smokeless tobacco: Never  ?Substance and Sexual Activity  ? Alcohol use: Yes  ?  Comment: social  ? Drug use: Never  ? Sexual activity: Not on file  ?Other Topics Concern  ? Not on file  ?Social History Narrative  ? Not on file  ? ?Social Determinants of Health  ? ?Financial  Resource Strain: Not on file  ?Food Insecurity: Not on file  ?Transportation Needs: Not on file  ?Physical Activity: Not on file  ?Stress: Not on file  ?Social Connections: Not on file  ?Intimate Partner Violence: Not on file  ? ? ? ? ?Review of Systems  ?Constitutional:  Positive for fatigue. Negative for chills and unexpected weight change.  ?HENT:  Negative for congestion, postnasal drip, rhinorrhea, sneezing and sore throat.   ?Eyes:  Negative for redness.  ?Respiratory:  Negative for cough, chest tightness and shortness of breath.   ?Cardiovascular:  Negative for chest pain and palpitations.  ?Gastrointestinal:  Negative for abdominal pain, constipation, diarrhea, nausea and vomiting.  ?Genitourinary:  Negative for dysuria and frequency.  ?Musculoskeletal:  Positive for arthralgias and back pain. Negative for joint swelling and neck pain.  ?Skin:  Negative for rash.  ?Neurological: Negative.  Negative for tremors and numbness.  ?Hematological:  Negative for adenopathy. Does not bruise/bleed easily.  ?Psychiatric/Behavioral:  Positive for sleep disturbance. Negative for behavioral problems (Depression) and suicidal ideas. The patient is not nervous/anxious.   ? ?Vital Signs: ?Ht 5\' 9"  (1.753 m)   Wt 203 lb (92.1 kg)   BMI 29.98 kg/m?  ? ? ?Observation/Objective: ? ?Pt is able to carry out conversation ? ? ?Assessment/Plan: ?1. OSA (obstructive sleep apnea) ?Will order CPAP at 7 cm H2O ? 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 ? ?General Counseling: demon volante understanding of the findings of today's phone visit and agrees with plan of treatment. I have discussed any further diagnostic evaluation that may be needed or ordered today. We also reviewed his medications today. he has been encouraged to call the office with any questions or concerns that should arise related to todays visit. ? ? ? ?No orders of  the defined types were placed in this encounter. ? ? ?No orders of the defined types were placed in this encounter. ? ? ?Time spent:30 Minutes ? ? ? ?Dr Wallis Bamberg ?Internal medicine  ?

## 2022-01-03 ENCOUNTER — Encounter: Admitting: Physical Therapy

## 2022-01-05 ENCOUNTER — Encounter: Admitting: Physical Therapy

## 2022-01-05 ENCOUNTER — Telehealth: Payer: Self-pay

## 2022-01-05 NOTE — Telephone Encounter (Signed)
Left vm and sent mychart message to confirm 01/09/22 appointment-Toni ?

## 2022-01-09 ENCOUNTER — Encounter: Payer: Self-pay | Admitting: Physician Assistant

## 2022-01-09 ENCOUNTER — Other Ambulatory Visit: Payer: Self-pay

## 2022-01-09 ENCOUNTER — Ambulatory Visit (INDEPENDENT_AMBULATORY_CARE_PROVIDER_SITE_OTHER): Admitting: Physician Assistant

## 2022-01-09 DIAGNOSIS — M5442 Lumbago with sciatica, left side: Secondary | ICD-10-CM

## 2022-01-09 DIAGNOSIS — M5441 Lumbago with sciatica, right side: Secondary | ICD-10-CM

## 2022-01-09 DIAGNOSIS — Z0001 Encounter for general adult medical examination with abnormal findings: Secondary | ICD-10-CM | POA: Diagnosis not present

## 2022-01-09 DIAGNOSIS — R350 Frequency of micturition: Secondary | ICD-10-CM

## 2022-01-09 DIAGNOSIS — M25511 Pain in right shoulder: Secondary | ICD-10-CM | POA: Diagnosis not present

## 2022-01-09 DIAGNOSIS — G43709 Chronic migraine without aura, not intractable, without status migrainosus: Secondary | ICD-10-CM

## 2022-01-09 DIAGNOSIS — G8929 Other chronic pain: Secondary | ICD-10-CM

## 2022-01-09 DIAGNOSIS — R300 Dysuria: Secondary | ICD-10-CM

## 2022-01-09 DIAGNOSIS — G4733 Obstructive sleep apnea (adult) (pediatric): Secondary | ICD-10-CM

## 2022-01-09 NOTE — Progress Notes (Cosign Needed)
[facility], [place] [address]  Internal MEDICINE  Office Visit Note  Patient Name: Evan Leach  [id number]  [id number]  Date of Service: 01/09/2022  Chief Complaint  Patient presents with   Annual Exam   Depression   Gastroesophageal Reflux   Anxiety   Shoulder Injury    Recently completed a 2 week course for work that aggravated shoulder and back pain once again     HPI Pt is here for routine health maintenance examination -Was gone for training for 2 weeks and had a flare of his shoulder and low back pain during some of the exercises  -Had previously completed PT for shoulder, has upcoming  back PT and will discuss shoulder again if still bothersome. He may need ortho follow up for further shoulder PT if this has already been completed. -Frequent urination, sometimes incomplete emptying at night. Reports he has to get up every hour to use restroom throughout the day. 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 Based on symptoms and age discussed referring to urology for further evaluation. Will go ahead and recheck labs to ensure no elevated glucose or impaired kidney function contributing.  -hx of IBS, some abdominal bloating at times. Discussed fiber supplement and GasX or laxative if needed -followed by neurology for headaches, was started on effexor and maxalt with recommendation for starting cpap as this may be contributing -states he did receive call about cpap but there was a switch with his insurance that was showing he had a copay which he states he shouldn't and he is having them update this so he can move forward with machine set up. -Will go for routine fasting labs previously ordered-slip reprinted  Current Medication: Outpatient Encounter Medications as of 01/09/2022  Medication Sig   cetirizine (ZYRTEC) 10 MG tablet Take 1 tablet (10 mg total) by mouth daily.    cyclobenzaprine (FLEXERIL) 5 MG tablet Take 1 tablet (5 mg total) by mouth at bedtime.   fluticasone (FLONASE) 50 MCG/ACT nasal spray Place 2 sprays into both nostrils in the morning and at bedtime.   hydrOXYzine (ATARAX/VISTARIL) 25 MG tablet Take by mouth.   ibuprofen (ADVIL) 800 MG tablet Take 1 tablet (800 mg total) by mouth every 8 (eight) hours as needed for mild pain.   indomethacin (INDOCIN) 25 MG capsule Take by mouth.   omeprazole (PRILOSEC) 20 MG capsule Take by mouth.   ondansetron (ZOFRAN-ODT) 4 MG disintegrating tablet Take 1 tablet (4 mg total) by mouth every 6 (six) hours as needed for nausea or vomiting.   oxyCODONE-acetaminophen (PERCOCET) 5-325 MG tablet Take 2 tablets by mouth every 6 (six) hours as needed for severe pain.   predniSONE (DELTASONE) 10 MG tablet Take 6 tablets (60 mg total) by mouth daily. Take 60 mg (6 tablets) x 2 days, then 50 mg (5 tablets) x 2 days, then 40 mg (4 tablets) x 2 days, then 30 mg (3 tablets) x 2 days, then 20 mg (2 tablets) x 2 days, then 10 mg (1 tablet) x 2 days then STOP   No facility-administered encounter medications on file as of 01/09/2022.    Surgical History: Past Surgical History:  Procedure Laterality Date   WISDOM TOOTH EXTRACTION      Medical History: Past Medical History:  Diagnosis Date   Anxiety    Depression    GERD (gastroesophageal reflux disease)     Family History: Family  History  Problem Relation Age of Onset   Diabetes Maternal Grandmother    Diabetes Maternal Grandfather    Diabetes Paternal Grandmother       Review of Systems  Constitutional:  Positive for fatigue. Negative for chills and unexpected weight change.  HENT:  Negative for congestion, postnasal drip, rhinorrhea, sneezing and sore throat.   Eyes:  Negative for redness.  Respiratory:  Positive for apnea. Negative for cough, chest tightness and shortness of breath.   Cardiovascular:  Negative for chest pain and palpitations.   Gastrointestinal:  Negative for abdominal pain, constipation, diarrhea, nausea and vomiting.  Genitourinary:  Positive for frequency. Negative for dysuria.  Musculoskeletal:  Positive for arthralgias and back pain. Negative for joint swelling and neck pain.  Skin:  Negative for rash.  Neurological:  Positive for numbness and headaches. Negative for tremors.  Hematological:  Negative for adenopathy. Does not bruise/bleed easily.  Psychiatric/Behavioral:  Positive for sleep disturbance. Negative for behavioral problems (Depression) and suicidal ideas. The patient is not nervous/anxious.     Vital Signs: BP 129/82    Pulse 63    Temp 98.4 F (36.9 C)    Resp 16    Ht 5\' 8"  (1.727 m)    Wt 203 lb 12.8 oz (92.4 kg)    SpO2 97%    BMI 30.99 kg/m    Physical Exam Vitals and nursing note reviewed.  Constitutional:      General: He is not in acute distress.    Appearance: He is well-developed. He is obese. He is not diaphoretic.  HENT:     Head: Normocephalic and atraumatic.     Mouth/Throat:     Pharynx: No oropharyngeal exudate.  Eyes:     Pupils: Pupils are equal, round, and reactive to light.  Neck:     Thyroid: No thyromegaly.     Vascular: No JVD.     Trachea: No tracheal deviation.  Cardiovascular:     Rate and Rhythm: Normal rate and regular rhythm.     Heart sounds: Normal heart sounds. No murmur heard.   No friction rub. No gallop.  Pulmonary:     Effort: Pulmonary effort is normal. No respiratory distress.     Breath sounds: No wheezing or rales.  Chest:     Chest wall: No tenderness.  Abdominal:     General: Bowel sounds are normal.     Palpations: Abdomen is soft.     Tenderness: There is abdominal tenderness.     Comments: Mild tenderness in low abdomen with recent bloating  Musculoskeletal:        General: Tenderness present. Normal range of motion.     Cervical back: Normal range of motion and neck supple.  Lymphadenopathy:     Cervical: No cervical  adenopathy.  Skin:    General: Skin is warm and dry.  Neurological:     Mental Status: He is alert and oriented to person, place, and time.     Cranial Nerves: No cranial nerve deficit.  Psychiatric:        Behavior: Behavior normal.        Thought Content: Thought content normal.        Judgment: Judgment normal.     LABS: No results found for this or any previous visit (from the past 2160 hour(s)).      Assessment/Plan: 1. Encounter for general adult medical examination with abnormal findings CPE performed, routine fasting labs previously ordered and reprinted to have done now  2. Frequent urination Due to continued symptoms for over a year and given patient's age we will go ahead and refer to urology for further evaluation.  In the meantime urine will be retested and labs rechecked to ensure no elevated glucose or impaired kidney function that could be contributing to symptoms - Ambulatory referral to Urology  3. OSA (obstructive sleep apnea) Awaiting CPAP set up  4. Chronic bilateral low back pain with bilateral sciatica Followed by Ortho and PT  5. Chronic right shoulder pain Followed by Ortho and PT  6. Chronic migraine without aura without status migrainosus, not intractable Followed by neurology  7. Dysuria - UA/M w/rflx Culture, Routine   General Counseling: tami tardo understanding of the findings of todays visit and agrees with plan of treatment. I have discussed any further diagnostic evaluation that may be needed or ordered today. We also reviewed his medications today. he has been encouraged to call the office with any questions or concerns that should arise related to todays visit.    Counseling:    Orders Placed This Encounter  Procedures   UA/M w/rflx Culture, Routine   Ambulatory referral to Urology    No orders of the defined types were placed in this encounter.   This patient was seen by Drema Dallas, PA-C in collaboration  with Dr. Clayborn Bigness as a part of collaborative care agreement.  Total time spent:30 Minutes  Time spent includes review of chart, medications, test results, and follow up plan with the patient.     Lavera Guise, MD  Internal Medicine

## 2022-01-10 ENCOUNTER — Encounter: Admitting: Physical Therapy

## 2022-01-10 LAB — UA/M W/RFLX CULTURE, ROUTINE
Bilirubin, UA: NEGATIVE
Glucose, UA: NEGATIVE
Ketones, UA: NEGATIVE
Leukocytes,UA: NEGATIVE
Nitrite, UA: NEGATIVE
Protein,UA: NEGATIVE
RBC, UA: NEGATIVE
Specific Gravity, UA: 1.005 (ref 1.005–1.030)
Urobilinogen, Ur: 0.2 mg/dL (ref 0.2–1.0)
pH, UA: 6 (ref 5.0–7.5)

## 2022-01-10 LAB — MICROSCOPIC EXAMINATION
Bacteria, UA: NONE SEEN
Casts: NONE SEEN /lpf
Epithelial Cells (non renal): NONE SEEN /hpf (ref 0–10)
RBC, Urine: NONE SEEN /hpf (ref 0–2)
WBC, UA: NONE SEEN /hpf (ref 0–5)

## 2022-01-12 ENCOUNTER — Ambulatory Visit: Attending: Physical Medicine & Rehabilitation | Admitting: Physical Therapy

## 2022-01-12 ENCOUNTER — Encounter: Payer: Self-pay | Admitting: Physical Therapy

## 2022-01-12 ENCOUNTER — Other Ambulatory Visit: Payer: Self-pay

## 2022-01-12 VITALS — BP 126/70 | HR 77

## 2022-01-12 DIAGNOSIS — G8929 Other chronic pain: Secondary | ICD-10-CM | POA: Insufficient documentation

## 2022-01-12 DIAGNOSIS — M545 Low back pain, unspecified: Secondary | ICD-10-CM | POA: Diagnosis present
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
## 2022-01-17 ENCOUNTER — Ambulatory Visit: Admitting: Physical Therapy

## 2022-01-17 ENCOUNTER — Other Ambulatory Visit: Payer: Self-pay

## 2022-01-17 ENCOUNTER — Encounter: Payer: Self-pay | Admitting: Physical Therapy

## 2022-01-17 DIAGNOSIS — M545 Low back pain, unspecified: Secondary | ICD-10-CM
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
## 2022-01-18 ENCOUNTER — Encounter: Payer: Self-pay | Admitting: Physical Therapy

## 2022-01-19 ENCOUNTER — Other Ambulatory Visit: Payer: Self-pay

## 2022-01-19 ENCOUNTER — Ambulatory Visit: Admitting: Physical Therapy

## 2022-01-19 ENCOUNTER — Encounter: Payer: Self-pay | Admitting: Physical Therapy

## 2022-01-19 DIAGNOSIS — G8929 Other chronic pain: Secondary | ICD-10-CM

## 2022-01-19 DIAGNOSIS — M545 Low back pain, unspecified: Secondary | ICD-10-CM
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
## 2022-01-23 ENCOUNTER — Telehealth: Payer: Self-pay

## 2022-01-23 NOTE — Telephone Encounter (Signed)
[employer] authorization request faxed to [employer] @ [phone] for urology, orthopedic, physical therapy and neurology referrals. Was not aware patient had prime-[employer] ?

## 2022-01-24 ENCOUNTER — Other Ambulatory Visit: Payer: Self-pay

## 2022-01-24 ENCOUNTER — Ambulatory Visit: Admitting: Physical Therapy

## 2022-01-24 ENCOUNTER — Encounter: Payer: Self-pay | Admitting: Physical Therapy

## 2022-01-24 DIAGNOSIS — M545 Low back pain, unspecified: Secondary | ICD-10-CM
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
## 2022-01-25 ENCOUNTER — Encounter: Payer: Self-pay | Admitting: Urology

## 2022-01-25 ENCOUNTER — Ambulatory Visit: Payer: Self-pay | Admitting: Urology

## 2022-01-26 ENCOUNTER — Ambulatory Visit: Admitting: Physical Therapy

## 2022-01-31 ENCOUNTER — Ambulatory Visit: Admitting: Physical Therapy

## 2022-02-07 ENCOUNTER — Ambulatory Visit: Attending: Physical Medicine & Rehabilitation | Admitting: Physical Therapy

## 2022-02-07 ENCOUNTER — Telehealth: Payer: Self-pay

## 2022-02-07 ENCOUNTER — Encounter: Payer: Self-pay | Admitting: Physical Therapy

## 2022-02-07 DIAGNOSIS — G8929 Other chronic pain: Secondary | ICD-10-CM | POA: Diagnosis present

## 2022-02-07 DIAGNOSIS — M545 Low back pain, unspecified: Secondary | ICD-10-CM | POA: Diagnosis present

## 2022-02-07 DIAGNOSIS — M6281 Muscle weakness (generalized): Secondary | ICD-10-CM | POA: Insufficient documentation

## 2022-02-07 NOTE — Telephone Encounter (Signed)
Feeling Randie Heinz has to reach the patient to schedule machine setup appointment on 3/7, 3/14 and 01/25/22 ?
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
## 2022-02-08 ENCOUNTER — Encounter: Payer: Self-pay | Admitting: Physical Therapy

## 2022-02-09 ENCOUNTER — Ambulatory Visit (INDEPENDENT_AMBULATORY_CARE_PROVIDER_SITE_OTHER): Admitting: Physician Assistant

## 2022-02-09 ENCOUNTER — Encounter: Payer: Self-pay | Admitting: Physician Assistant

## 2022-02-09 VITALS — BP 129/75 | HR 62 | Temp 98.3°F | Resp 16 | Ht 68.0 in | Wt 204.0 lb

## 2022-02-09 DIAGNOSIS — F411 Generalized anxiety disorder: Secondary | ICD-10-CM | POA: Diagnosis not present

## 2022-02-09 DIAGNOSIS — K589 Irritable bowel syndrome without diarrhea: Secondary | ICD-10-CM

## 2022-02-09 DIAGNOSIS — K219 Gastro-esophageal reflux disease without esophagitis: Secondary | ICD-10-CM | POA: Diagnosis not present

## 2022-02-09 MED ORDER — OMEPRAZOLE 40 MG PO CPDR: 40.0000 mg | DELAYED_RELEASE_CAPSULE | Freq: Every day | ORAL | 3 refills | Status: AC

## 2022-02-09 MED ORDER — BUPROPION HCL ER (XL) 150 MG PO TB24: 150.0000 mg | ORAL_TABLET | Freq: Every day | ORAL | 2 refills | Status: AC

## 2022-02-09 NOTE — Progress Notes (Signed)
?[facility] ?[address] ?[place], [place] [address] ? ?Internal MEDICINE  ?Office Visit Note ? ?Patient Name: Evan Leach ? [id number]  ?[id number] ? ?Date of Service: 02/10/2022 ? ?Chief Complaint  ?Patient presents with  ? Abdominal Pain  ? Heartburn  ?  Heartburn has recently gotten worse  ? ? ? ?HPI ?Pt is here for a sick visit. ?-When he gets anxious exacerbates heartburn and IBS symptoms ?-Recently when going to work had to use restroom before work and then also on the way into work as well ?-Takes hydroxyzine as needed and it helps some, but not completely. Only takes once per day if that ?-will get palpitation, nausea, and sweating with anxiety and finds he has more anxiety over work now ?-Back has gotten worse, he tries to limits movement as much as possible. Has been doing PT, but cancelled the last two appts because back was too bad. Has had moments where he would take a step and has to bend forward to catch himself when pain kicks in. Takes flexeril occasionally. Had previously had a steroid injection. He will follow back up with ortho since he is missing PT due to severe pain. ?-Has been taking effexor 37.5 mg BID, but not helping anxiety. He was started on this by neurology for migraine prevention. May discuss alternative dosing or change and start something like zoloft for anxiety instead. In the meantime will add wellbutrin and omeprazole to help current symptoms and can increase hydroxyzine and take up to 3 times per day if needed. ?-Still waiting to start cpap ? ?Current Medication: ? ?Outpatient Encounter Medications as of 02/09/2022  ?Medication Sig  ? buPROPion (WELLBUTRIN XL) 150 MG 24 hr tablet Take 1 tablet (150 mg total) by mouth daily.  ? cetirizine (ZYRTEC) 10 MG tablet Take 1 tablet (10 mg total) by mouth daily.  ? cyclobenzaprine (FLEXERIL) 5 MG tablet Take 1 tablet (5 mg total) by mouth at bedtime.  ? fluticasone (FLONASE) 50 MCG/ACT nasal spray Place 2 sprays into both  nostrils in the morning and at bedtime.  ? hydrOXYzine (ATARAX/VISTARIL) 25 MG tablet Take by mouth.  ? ibuprofen (ADVIL) 800 MG tablet Take 1 tablet (800 mg total) by mouth every 8 (eight) hours as needed for mild pain.  ? indomethacin (INDOCIN) 25 MG capsule Take by mouth.  ? omeprazole (PRILOSEC) 40 MG capsule Take 1 capsule (40 mg total) by mouth daily.  ? ondansetron (ZOFRAN-ODT) 4 MG disintegrating tablet Take 1 tablet (4 mg total) by mouth every 6 (six) hours as needed for nausea or vomiting.  ? oxyCODONE-acetaminophen (PERCOCET) 5-325 MG tablet Take 2 tablets by mouth every 6 (six) hours as needed for severe pain.  ? predniSONE (DELTASONE) 10 MG tablet Take 6 tablets (60 mg total) by mouth daily. Take 60 mg (6 tablets) x 2 days, then 50 mg (5 tablets) x 2 days, then 40 mg (4 tablets) x 2 days, then 30 mg (3 tablets) x 2 days, then 20 mg (2 tablets) x 2 days, then 10 mg (1 tablet) x 2 days then STOP  ? [DISCONTINUED] omeprazole (PRILOSEC) 20 MG capsule Take by mouth.  ? ?No facility-administered encounter medications on file as of 02/09/2022.  ? ? ? ? ?Medical History: ?Past Medical History:  ?Diagnosis Date  ? Anxiety   ? Depression   ? GERD (gastroesophageal reflux disease)   ? ? ? ?Vital Signs: ?BP 129/75   Pulse 62   Temp 98.3 ?F (36.8 ?C)  Resp 16   Ht 5\' 8"  (1.727 m)   Wt 204 lb (92.5 kg)   SpO2 98%   BMI 31.02 kg/m?  ? ? ?Review of Systems  ?Constitutional:  Negative for fatigue and fever.  ?HENT:  Negative for congestion, mouth sores and postnasal drip.   ?Respiratory:  Negative for cough.   ?Cardiovascular:  Negative for chest pain.  ?Gastrointestinal:  Positive for abdominal pain.  ?     Increased frequency of BM and GERD  ?Genitourinary:  Negative for flank pain.  ?Musculoskeletal:  Positive for arthralgias and back pain.  ?Psychiatric/Behavioral:  Positive for sleep disturbance. The patient is nervous/anxious.   ? ?Physical Exam ?Vitals and nursing note reviewed.  ?Constitutional:   ?    General: He is not in acute distress. ?   Appearance: He is well-developed. He is not diaphoretic.  ?HENT:  ?   Head: Normocephalic and atraumatic.  ?   Mouth/Throat:  ?   Pharynx: No oropharyngeal exudate.  ?Eyes:  ?   Pupils: Pupils are equal, round, and reactive to light.  ?Neck:  ?   Thyroid: No thyromegaly.  ?   Vascular: No JVD.  ?   Trachea: No tracheal deviation.  ?Cardiovascular:  ?   Rate and Rhythm: Normal rate and regular rhythm.  ?   Heart sounds: Normal heart sounds. No murmur heard. ?  No friction rub. No gallop.  ?Pulmonary:  ?   Effort: Pulmonary effort is normal. No respiratory distress.  ?   Breath sounds: No wheezing or rales.  ?Chest:  ?   Chest wall: No tenderness.  ?Abdominal:  ?   General: Bowel sounds are normal. There is no distension.  ?   Palpations: Abdomen is soft.  ?   Tenderness: There is no guarding.  ?Musculoskeletal:     ?   General: Normal range of motion.  ?   Cervical back: Normal range of motion and neck supple.  ?Lymphadenopathy:  ?   Cervical: No cervical adenopathy.  ?Skin: ?   General: Skin is warm and dry.  ?Neurological:  ?   Mental Status: He is alert and oriented to person, place, and time.  ?   Cranial Nerves: No cranial nerve deficit.  ?Psychiatric:     ?   Behavior: Behavior normal.     ?   Thought Content: Thought content normal.     ?   Judgment: Judgment normal.  ? ? ? ? ?Assessment/Plan: ?1. GAD (generalized anxiety disorder) ?Will discuss adjustment of effexor dose at upcoming neurology visit as this may need to be increased to help anxiety or if migraine preventative changed then may add alternative such as zoloft. Will continue effexor as prescribed currently though and add wellbutrin as well increase hydroxyzine to TID as needed. ?- buPROPion (WELLBUTRIN XL) 150 MG 24 hr tablet; Take 1 tablet (150 mg total) by mouth daily.  Dispense: 30 tablet; Refill: 2 ? ?2. Gastroesophageal reflux disease without esophagitis ?Will start on omeprazole to help reflux though  may improve some with better anxiety control ?- omeprazole (PRILOSEC) 40 MG capsule; Take 1 capsule (40 mg total) by mouth daily.  Dispense: 30 capsule; Refill: 3 ? ?3. Irritable bowel syndrome, unspecified type ?Will start omeprazole and work on improving anxiety as this is main trigger for his IBS symptoms. If not improving with better control of anxiety may need to see GI ? ? ?General Counseling: dajohn ellender understanding of the findings of todays visit and agrees with  plan of treatment. I have discussed any further diagnostic evaluation that may be needed or ordered today. We also reviewed his medications today. he has been encouraged to call the office with any questions or concerns that should arise related to todays visit. ? ? ? ?Counseling: ? ? ? ?No orders of the defined types were placed in this encounter. ? ? ?Meds ordered this encounter  ?Medications  ? omeprazole (PRILOSEC) 40 MG capsule  ?  Sig: Take 1 capsule (40 mg total) by mouth daily.  ?  Dispense:  30 capsule  ?  Refill:  3  ? buPROPion (WELLBUTRIN XL) 150 MG 24 hr tablet  ?  Sig: Take 1 tablet (150 mg total) by mouth daily.  ?  Dispense:  30 tablet  ?  Refill:  2  ? ? ?Time spent:30 Minutes ?

## 2022-02-14 ENCOUNTER — Telehealth: Payer: Self-pay | Admitting: Physical Therapy

## 2022-02-14 ENCOUNTER — Ambulatory Visit: Admitting: Physical Therapy

## 2022-02-14 NOTE — Telephone Encounter (Signed)
Called pt to inquiry about absence. Pt forgot that he had apt today, and this was the last apt scheduled. PT instructed pt to call back and schedule more apts.  ?

## 2022-02-15 ENCOUNTER — Encounter: Admitting: Physical Therapy

## 2022-02-20 ENCOUNTER — Encounter: Payer: Self-pay | Admitting: Physical Therapy

## 2022-02-20 ENCOUNTER — Ambulatory Visit: Admitting: Physical Therapy

## 2022-02-20 DIAGNOSIS — M545 Low back pain, unspecified: Secondary | ICD-10-CM | POA: Diagnosis not present

## 2022-02-20 DIAGNOSIS — M6281 Muscle weakness (generalized): Secondary | ICD-10-CM
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
## 2022-02-22 ENCOUNTER — Ambulatory Visit: Admitting: Physical Therapy

## 2022-02-22 ENCOUNTER — Telehealth: Payer: Self-pay | Admitting: Physical Therapy

## 2022-02-22 NOTE — Telephone Encounter (Signed)
Called pt to inquire about absence. She did not respond so VM left instructing pt to call back to check in.  ?

## 2022-03-03 ENCOUNTER — Ambulatory Visit (INDEPENDENT_AMBULATORY_CARE_PROVIDER_SITE_OTHER): Admitting: Physician Assistant

## 2022-03-03 ENCOUNTER — Encounter: Payer: Self-pay | Admitting: Physician Assistant

## 2022-03-03 VITALS — BP 128/84 | HR 68 | Temp 97.8°F | Resp 16 | Ht 68.0 in | Wt 201.0 lb

## 2022-03-03 DIAGNOSIS — G43709 Chronic migraine without aura, not intractable, without status migrainosus: Secondary | ICD-10-CM

## 2022-03-03 DIAGNOSIS — F411 Generalized anxiety disorder: Secondary | ICD-10-CM | POA: Diagnosis not present

## 2022-03-03 DIAGNOSIS — G4733 Obstructive sleep apnea (adult) (pediatric): Secondary | ICD-10-CM | POA: Diagnosis not present

## 2022-03-03 DIAGNOSIS — F331 Major depressive disorder, recurrent, moderate: Secondary | ICD-10-CM

## 2022-03-03 MED ORDER — VENLAFAXINE HCL 75 MG PO TABS: 75.0000 mg | ORAL_TABLET | Freq: Two times a day (BID) | ORAL | 2 refills | Status: DC

## 2022-03-03 NOTE — Progress Notes (Signed)
[facility] ?[address] ?[place], [place] [address] ? ?Internal MEDICINE  ?Office Visit Note ? ?Patient Name: Evan Leach ? [id number]  ?[id number] ? ?Date of Service: 03/03/2022 ? ?Chief Complaint  ?Patient presents with  ? Follow-up  ? Anxiety  ? Depression  ? Gastroesophageal Reflux  ? ? ?HPI ?Pt is here for routine follow up after med change ?-Unable to get cpap due to [facility] needing to rent 20months and will be getting off [facility] next month. Will go through [facility] instead ?-Taking 37.5mg  effexor twice per day for migraines and has helped some, but not completely and states he is about out of this. Sees neurology on the 11th. Had discussed increasing to help anxiety and depression as well and will go ahead and have him increase to 75mg  twice per day and can discuss further with neurology in case of any other changes they recommend. ?-wellbutrin 150mg  added last visit has not helped much, but no side effects, discussed holding at this dose while we increase effexor, but may increase this in future as well ?-Doing counseling through the [facility] twice per month, and will likely establish with psychiatry as well once he fully establishes with the [facility]. Denies SI/HI ?-taking hydroxyzine every other day. Discussed again that he can take this up to 3x per day if needed for acute anxiety and he admits he might need to be taking more often than every other day, especially with work picking up  ?-Was doing some PT, but now with work picking back up 6am-5pm M-F made this difficult to continue. ? ?Current Medication: ?Outpatient Encounter Medications as of 03/03/2022  ?Medication Sig  ? buPROPion (WELLBUTRIN XL) 150 MG 24 hr tablet Take 1 tablet (150 mg total) by mouth daily.  ? cetirizine (ZYRTEC) 10 MG tablet Take 1 tablet (10 mg total) by mouth daily.  ? cyclobenzaprine (FLEXERIL) 5 MG tablet Take 1 tablet (5 mg total) by mouth at bedtime.  ? fluticasone (FLONASE) 50 MCG/ACT nasal spray Place 2 sprays into both nostrils in  the morning and at bedtime.  ? hydrOXYzine (ATARAX/VISTARIL) 25 MG tablet Take by mouth.  ? ibuprofen (ADVIL) 800 MG tablet Take 1 tablet (800 mg total) by mouth every 8 (eight) hours as needed for mild pain.  ? indomethacin (INDOCIN) 25 MG capsule Take by mouth.  ? omeprazole (PRILOSEC) 40 MG capsule Take 1 capsule (40 mg total) by mouth daily.  ? ondansetron (ZOFRAN-ODT) 4 MG disintegrating tablet Take 1 tablet (4 mg total) by mouth every 6 (six) hours as needed for nausea or vomiting.  ? oxyCODONE-acetaminophen (PERCOCET) 5-325 MG tablet Take 2 tablets by mouth every 6 (six) hours as needed for severe pain.  ? predniSONE (DELTASONE) 10 MG tablet Take 6 tablets (60 mg total) by mouth daily. Take 60 mg (6 tablets) x 2 days, then 50 mg (5 tablets) x 2 days, then 40 mg (4 tablets) x 2 days, then 30 mg (3 tablets) x 2 days, then 20 mg (2 tablets) x 2 days, then 10 mg (1 tablet) x 2 days then STOP  ? venlafaxine (EFFEXOR) 75 MG tablet Take 1 tablet (75 mg total) by mouth 2 (two) times daily.  ? ?No facility-administered encounter medications on file as of 03/03/2022.  ? ? ?Surgical History: ?Past Surgical History:  ?Procedure Laterality Date  ? WISDOM TOOTH EXTRACTION    ? ? ?Medical History: ?Past Medical History:  ?Diagnosis Date  ? Anxiety   ? Depression   ? GERD (gastroesophageal  reflux disease)   ? ? ?Family History: ?Family History  ?Problem Relation Age of Onset  ? Diabetes Maternal Grandmother   ? Diabetes Maternal Grandfather   ? Diabetes Paternal Grandmother   ? ? ?Social History  ? ?Socioeconomic History  ? Marital status: Single  ?  Spouse name: Not on file  ? Number of children: Not on file  ? Years of education: Not on file  ? Highest education level: Not on file  ?Occupational History  ? Not on file  ?Tobacco Use  ? Smoking status: Never  ? Smokeless tobacco: Never  ?Substance and Sexual Activity  ? Alcohol use: Yes  ?  Comment: social  ? Drug use: Never  ? Sexual activity: Not on file  ?Other Topics  Concern  ? Not on file  ?Social History Narrative  ? Not on file  ? ?Social Determinants of Health  ? ?Financial Resource Strain: Not on file  ?Food Insecurity: Not on file  ?Transportation Needs: Not on file  ?Physical Activity: Not on file  ?Stress: Not on file  ?Social Connections: Not on file  ?Intimate Partner Violence: Not on file  ? ? ? ? ?Review of Systems  ?Constitutional:  Positive for fatigue. Negative for chills and unexpected weight change.  ?HENT:  Negative for congestion, postnasal drip, rhinorrhea, sneezing and sore throat.   ?Eyes:  Negative for redness.  ?Respiratory:  Positive for apnea. Negative for cough, chest tightness and shortness of breath.   ?Cardiovascular:  Negative for chest pain and palpitations.  ?Gastrointestinal:  Negative for abdominal pain, constipation, diarrhea, nausea and vomiting.  ?Genitourinary:  Negative for dysuria.  ?Musculoskeletal:  Positive for arthralgias and back pain. Negative for joint swelling and neck pain.  ?Skin:  Negative for rash.  ?Neurological:  Positive for numbness and headaches. Negative for tremors.  ?Hematological:  Negative for adenopathy. Does not bruise/bleed easily.  ?Psychiatric/Behavioral:  Positive for behavioral problems (Depression) and sleep disturbance. Negative for suicidal ideas. The patient is nervous/anxious.   ? ?Vital Signs: ?BP 128/84   Pulse 68   Temp 97.8 ?F (36.6 ?C)   Resp 16   Ht 5\' 8"  (1.727 m)   Wt 201 lb (91.2 kg)   SpO2 96%   BMI 30.56 kg/m?  ? ? ?Physical Exam ?Vitals and nursing note reviewed.  ?Constitutional:   ?   General: He is not in acute distress. ?   Appearance: He is well-developed. He is not diaphoretic.  ?HENT:  ?   Head: Normocephalic and atraumatic.  ?   Mouth/Throat:  ?   Pharynx: No oropharyngeal exudate.  ?Eyes:  ?   Pupils: Pupils are equal, round, and reactive to light.  ?Neck:  ?   Thyroid: No thyromegaly.  ?   Vascular: No JVD.  ?   Trachea: No tracheal deviation.  ?Cardiovascular:  ?   Rate and  Rhythm: Normal rate and regular rhythm.  ?   Heart sounds: Normal heart sounds. No murmur heard. ?  No friction rub. No gallop.  ?Pulmonary:  ?   Effort: Pulmonary effort is normal. No respiratory distress.  ?   Breath sounds: No wheezing or rales.  ?Chest:  ?   Chest wall: No tenderness.  ?Abdominal:  ?   General: Bowel sounds are normal. There is no distension.  ?   Palpations: Abdomen is soft.  ?   Tenderness: There is no guarding.  ?Musculoskeletal:     ?   General: Normal range of motion.  ?  Cervical back: Normal range of motion and neck supple.  ?Lymphadenopathy:  ?   Cervical: No cervical adenopathy.  ?Skin: ?   General: Skin is warm and dry.  ?Neurological:  ?   Mental Status: He is alert and oriented to person, place, and time.  ?   Cranial Nerves: No cranial nerve deficit.  ?Psychiatric:     ?   Behavior: Behavior normal.     ?   Thought Content: Thought content normal.     ?   Judgment: Judgment normal.  ? ? ? ? ? ?Assessment/Plan: ?1. GAD (generalized anxiety disorder) ?Will increase effexor to 75mg  BID, continue 150mg  wellbutrin, and may take hydroxyzine up to 3x Daily. Will continue with counseling with the [facility] and will see about establishing with psychiatry as well for further management. ?- venlafaxine (EFFEXOR) 75 MG tablet; Take 1 tablet (75 mg total) by mouth 2 (two) times daily.  Dispense: 30 tablet; Refill: 2 ? ?2. Moderate episode of recurrent major depressive disorder ([place]) ?Will increase effexor to 75mg  BID, continue 150mg  wellbutrin, and may take hydroxyzine up to 3x Daily. Will continue with counseling with the [facility] and will see about establishing with psychiatry as well for further management. ?- venlafaxine (EFFEXOR) 75 MG tablet; Take 1 tablet (75 mg total) by mouth 2 (two) times daily.  Dispense: 30 tablet; Refill: 2 ? ?3. Chronic migraine without aura without status migrainosus, not intractable ?Followed by neurology--will increase effexor for anx and dep and while continuing migraine  prevention unless otherwise stated by neurology ? ?4. OSA (obstructive sleep apnea) ?[facility] ? ? ?General Counseling: knoxton ansley understanding of the findings of todays visit and a

## 2022-03-31 ENCOUNTER — Encounter: Payer: Self-pay | Admitting: Physician Assistant

## 2022-03-31 ENCOUNTER — Ambulatory Visit (INDEPENDENT_AMBULATORY_CARE_PROVIDER_SITE_OTHER): Admitting: Physician Assistant

## 2022-03-31 VITALS — BP 130/84 | HR 76 | Temp 97.8°F | Resp 16 | Ht 68.0 in | Wt 207.0 lb

## 2022-03-31 DIAGNOSIS — G43709 Chronic migraine without aura, not intractable, without status migrainosus: Secondary | ICD-10-CM | POA: Diagnosis not present

## 2022-03-31 DIAGNOSIS — M5441 Lumbago with sciatica, right side: Secondary | ICD-10-CM

## 2022-03-31 DIAGNOSIS — F331 Major depressive disorder, recurrent, moderate: Secondary | ICD-10-CM

## 2022-03-31 DIAGNOSIS — M5442 Lumbago with sciatica, left side: Secondary | ICD-10-CM | POA: Diagnosis not present

## 2022-03-31 DIAGNOSIS — F411 Generalized anxiety disorder: Secondary | ICD-10-CM

## 2022-03-31 DIAGNOSIS — G8929 Other chronic pain: Secondary | ICD-10-CM

## 2022-03-31 NOTE — Progress Notes (Signed)
[facility] [address] [place], [place] [id number]  Internal MEDICINE  Office Visit Note  Patient Name: Evan CaterLarry Wahlert  2001  [id number]  Date of Service: 03/31/2022  Chief Complaint  Patient presents with   Follow-up   Anxiety   Depression   Back Pain    HPI Pt is here for routine follow up -Back pain flared the past few days. He is going to contact his ortho provider if not improving. He does have flexeril at home to use as needed in evening -[facility], this was for behavioral health visit. States they currently prefer we continue with his medications and work with them. He states they are supposed to call him to establish therapy -He is currently taking effexor 75mg  BID and hydroxyzine BID which has started helping with anxiety. He still has some anxiety and depression but is improving and would like to continue with current medications for now. -Had neurology visit to evaluate for carpal tunnel and he states the report showed a possible abnormality but not told any next steps or concerns -still [facility] about getting a [facility] as well  Current Medication: Outpatient Encounter Medications as of 03/31/2022  Medication Sig   buPROPion (WELLBUTRIN XL) 150 MG 24 hr tablet Take 1 tablet (150 mg total) by mouth daily.   cetirizine (ZYRTEC) 10 MG tablet Take 1 tablet (10 mg total) by mouth daily.   cyclobenzaprine (FLEXERIL) 5 MG tablet Take 1 tablet (5 mg total) by mouth at bedtime.   fluticasone (FLONASE) 50 MCG/ACT nasal spray Place 2 sprays into both nostrils in the morning and at bedtime.   hydrOXYzine (ATARAX/VISTARIL) 25 MG tablet Take by mouth.   ibuprofen (ADVIL) 800 MG tablet Take 1 tablet (800 mg total) by mouth every 8 (eight) hours as needed for mild pain.   indomethacin (INDOCIN) 25 MG capsule Take by mouth.   omeprazole (PRILOSEC) 40 MG capsule Take 1 capsule (40 mg total) by mouth daily.   ondansetron (ZOFRAN-ODT) 4 MG disintegrating  tablet Take 1 tablet (4 mg total) by mouth every 6 (six) hours as needed for nausea or vomiting.   oxyCODONE-acetaminophen (PERCOCET) 5-325 MG tablet Take 2 tablets by mouth every 6 (six) hours as needed for severe pain.   predniSONE (DELTASONE) 10 MG tablet Take 6 tablets (60 mg total) by mouth daily. Take 60 mg (6 tablets) x 2 days, then 50 mg (5 tablets) x 2 days, then 40 mg (4 tablets) x 2 days, then 30 mg (3 tablets) x 2 days, then 20 mg (2 tablets) x 2 days, then 10 mg (1 tablet) x 2 days then STOP   venlafaxine (EFFEXOR) 75 MG tablet Take 1 tablet (75 mg total) by mouth 2 (two) times daily.   No facility-administered encounter medications on file as of 03/31/2022.    Surgical History: Past Surgical History:  Procedure Laterality Date   WISDOM TOOTH EXTRACTION      Medical History: Past Medical History:  Diagnosis Date   Anxiety    Depression    GERD (gastroesophageal reflux disease)     Family History: Family History  Problem Relation Age of Onset   Diabetes Maternal Grandmother    Diabetes Maternal Grandfather    Diabetes Paternal Grandmother     Social History   Socioeconomic History   Marital status: Single    Spouse name: Not on file   Number of children: Not on file   Years of education: Not  on file   Highest education level: Not on file  Occupational History   Not on file  Tobacco Use   Smoking status: Never   Smokeless tobacco: Never  Substance and Sexual Activity   Alcohol use: Yes    Comment: social   Drug use: Never   Sexual activity: Not on file  Other Topics Concern   Not on file  Social History Narrative   Not on file   Social Determinants of Health   Financial Resource Strain: Not on file  Food Insecurity: Not on file  Transportation Needs: Not on file  Physical Activity: Not on file  Stress: Not on file  Social Connections: Not on file  Intimate Partner Violence: Not on file      Review of Systems  Constitutional:  Positive for  fatigue. Negative for chills and unexpected weight change.  HENT:  Negative for congestion, postnasal drip, rhinorrhea, sneezing and sore throat.   Eyes:  Negative for redness.  Respiratory:  Positive for apnea. Negative for cough, chest tightness and shortness of breath.   Cardiovascular:  Negative for chest pain and palpitations.  Gastrointestinal:  Negative for abdominal pain, constipation, diarrhea, nausea and vomiting.  Genitourinary:  Negative for dysuria.  Musculoskeletal:  Positive for arthralgias and back pain. Negative for joint swelling and neck pain.  Skin:  Negative for rash.  Neurological:  Positive for numbness and headaches. Negative for tremors.  Hematological:  Negative for adenopathy. Does not bruise/bleed easily.  Psychiatric/Behavioral:  Positive for behavioral problems (Depression) and sleep disturbance. Negative for suicidal ideas. The patient is nervous/anxious.    Vital Signs: BP 130/84   Pulse 76   Temp 97.8 F (36.6 C)   Resp 16   Ht 5\' 8"  (1.727 m)   Wt 207 lb (93.9 kg)   SpO2 98%   BMI 31.47 kg/m    Physical Exam Vitals and nursing note reviewed.  Constitutional:      General: He is not in acute distress.    Appearance: He is well-developed. He is not diaphoretic.  HENT:     Head: Normocephalic and atraumatic.     Mouth/Throat:     Pharynx: No oropharyngeal exudate.  Eyes:     Pupils: Pupils are equal, round, and reactive to light.  Neck:     Thyroid: No thyromegaly.     Vascular: No JVD.     Trachea: No tracheal deviation.  Cardiovascular:     Rate and Rhythm: Normal rate and regular rhythm.     Heart sounds: Normal heart sounds. No murmur heard.   No friction rub. No gallop.  Pulmonary:     Effort: Pulmonary effort is normal. No respiratory distress.     Breath sounds: No wheezing or rales.  Chest:     Chest wall: No tenderness.  Abdominal:     General: Bowel sounds are normal. There is no distension.     Palpations: Abdomen is soft.      Tenderness: There is no guarding.  Musculoskeletal:     Cervical back: Normal range of motion and neck supple.  Lymphadenopathy:     Cervical: No cervical adenopathy.  Skin:    General: Skin is warm and dry.  Neurological:     Mental Status: He is alert and oriented to person, place, and time.     Cranial Nerves: No cranial nerve deficit.  Psychiatric:        Behavior: Behavior normal.        Thought Content:  Thought content normal.        Judgment: Judgment normal.       Assessment/Plan: 1. GAD (generalized anxiety disorder) Improving, continue current medications [facility]  2. Moderate episode of recurrent major depressive disorder ([facility]) Improving, continue current medications and [facility]  3. Chronic migraine without aura without status migrainosus, not intractable Stable, followed by neurology  4. Chronic bilateral low back pain with bilateral sciatica Followed by ortho   General Counseling: Wallis Bamberg understanding of the findings of todays visit and agrees with plan of treatment. I have discussed any further diagnostic evaluation that may be needed or ordered today. We also reviewed his medications today. he has been encouraged to call the office with any questions or concerns that should arise related to todays visit.    No orders of the defined types were placed in this encounter.   No orders of the defined types were placed in this encounter.   This patient was seen by Lynn Ito, PA-C in collaboration with Dr. Beverely Risen as a part of collaborative care agreement.   Total time spent:30 Minutes Time spent includes review of chart, medications, test results, and follow up plan with the patient.      Dr Lyndon Code Internal medicine

## 2022-05-15 ENCOUNTER — Ambulatory Visit: Admitting: Physician Assistant

## 2022-06-15 ENCOUNTER — Telehealth: Payer: Self-pay

## 2022-06-15 ENCOUNTER — Other Ambulatory Visit: Payer: Self-pay | Admitting: Physician Assistant

## 2022-06-15 DIAGNOSIS — G8929 Other chronic pain: Secondary | ICD-10-CM

## 2022-06-27 ENCOUNTER — Ambulatory Visit: Attending: Physician Assistant | Admitting: Physical Therapy

## 2022-06-27 DIAGNOSIS — M545 Low back pain, unspecified: Secondary | ICD-10-CM | POA: Insufficient documentation

## 2022-06-27 DIAGNOSIS — M25551 Pain in right hip: Secondary | ICD-10-CM | POA: Insufficient documentation

## 2022-06-27 DIAGNOSIS — G8929 Other chronic pain: Secondary | ICD-10-CM | POA: Diagnosis present

## 2022-06-27 NOTE — Therapy (Signed)
OUTPATIENT PHYSICAL THERAPY THORACOLUMBAR EVALUATION   Patient Name: Evan Leach MRN: [id number] DOB:June 06, 2001, 21 y.o., male Today's Date: 06/28/2022   PT End of Session - 06/27/22 1348     Visit Number 1    Number of Visits 16    Date for PT Re-Evaluation 08/25/22    Authorization - Visit Number 1    Progress Note Due on Visit 10    PT Start Time 1345    PT Stop Time 1430    PT Time Calculation (min) 45 min    Activity Tolerance Patient tolerated treatment well    Behavior During Therapy WFL for tasks assessed/performed             Past Medical History:  Diagnosis Date   Anxiety    Depression    GERD (gastroesophageal reflux disease)    Past Surgical History:  Procedure Laterality Date   WISDOM TOOTH EXTRACTION     Patient Active Problem List   Diagnosis Date Noted   Strain of muscle of right groin region 04/07/2020   Acute right-sided low back pain with bilateral sciatica 04/07/2020   Right sided sciatica 04/07/2020   Right groin pain 04/07/2020    PCP: Lynn Ito PA  REFERRING PROVIDER: Lynn Ito PA  REFERRING DIAG: chronic R hip pain  Rationale for Evaluation and Treatment Rehabilitation  THERAPY DIAG:  Pain in right hip - Plan: PT plan of care cert/re-cert  Chronic bilateral low back pain, unspecified whether sciatica present - Plan: PT plan of care cert/re-cert  ONSET DATE: 4 years ago  SUBJECTIVE:                                                                                                                                                                                           SUBJECTIVE STATEMENT:  Chronic R back and hip pain   PERTINENT HISTORY:  R hip pain that began 4 years after hiking with a large pack and running. Has had insidious worsening pain over past 2 years. Reports pain is the R side of low back and R glute; with L sided pain over the past 2 years, not as bad as R. Reports pain is stabbing in the back and glute  with burning pain down inside of each leg. Current pain 4/10; lowest 2/10; worst 9/10. Pain is aggravated by stair negotiation, walking on uneven surfaces (on even surfaces as well but not as much), being in a car for more than , and with forward bending. 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 Would like to be able  to walk and ride in the car without pain. Pt denies N/V, B&B changes, unexplained weight fluctuation, saddle paresthesia, fever, night sweats, or unrelenting night pain at this time.   PAIN:  Are you having pain? Yes: NPRS scale: 4/10 Pain location: R hip and LB Pain description: stabbing pain with occasional radiation down RLE Aggravating factors: stair negotiation, walking on uneven surfaces (on even surfaces as well but not as much), being in a car for more than , and with forward bending Relieving factors: none- pain medication with minimal help   PRECAUTIONS: None  WEIGHT BEARING RESTRICTIONS No  FALLS:  Has patient fallen in last 6 months? Yes. Number of falls 5 falls from pain, takes a step and that step is painful and makes him collapse in pain   LIVING ENVIRONMENT: Lives with: lives with their partner Lives in: House/apartment Stairs: Yes: Internal: 12 steps; on left going up Has following equipment at home: None  OCCUPATION: [employer]   PLOF: Independent  PATIENT GOALS walk and ride in car without pain    OBJECTIVE:   DIAGNOSTIC FINDINGS:  MRI 09/2021 IMPRESSION: 1. Minimal annular disc bulging at L3-4 through L5-S1 without stenosis or neural impingement. 2. Otherwise unremarkable and normal MRI of the lumbar spine  PATIENT SURVEYS:  FOTO 21 goal 30  SCREENING FOR RED FLAGS: Bowel or bladder incontinence: No Spinal tumors: No Cauda equina syndrome: No Compression fracture: No Abdominal aneurysm: No  COGNITION:  Overall cognitive status: Within functional limits for  tasks assessed     SENSATION: WFL  MUSCLE LENGTH: Hamstrings: Right 150 deg; Left WNL Thomas test: WNL for motion bilat with stretch, concordant groin pain at R hip  POSTURE: decreased lumbar lordosis  PALPATION: Concordant pain   LUMBAR ROM:   Active  A/PROM  eval  Flexion WNL with concordant R sided glute and lumbar pain   Extension WNL  Right lateral flexion WNL  Left lateral flexion WNL  Right rotation WNL with concordant ipsilateral pain   Left rotation WNL with concordant ipsilateral pain    (Blank rows = not tested)  LOWER EXTREMITY ROM:     All BLE ROM WNL  LOWER EXTREMITY MMT:     All BLE  SL squat L: 2 R: 3 with pain  1RM leg press L 145 R 180  LUMBAR SPECIAL TESTS:  Straight leg raise test: Negative, Slump test: Negative, FABER test: Positive, and Thomas test: Positive bilat; Cross traight leg raise negative bilat  FUNCTIONAL TESTS:  10 meter walk test: 1.73m/s  GAIT: Distance walked: 51m Assistive device utilized: None Level of assistance: Complete Independence Comments: Compensated trendlenberg; L lateral trunk lean throughout    TODAY'S TREATMENT  PT reviewed the following HEP with patient with patient able to demonstrate a set of the following with min cuing for correction needed. PT educated patient on parameters of therex (how/when to inc/decrease intensity, frequency, rep/set range, stretch hold time, and purpose of therex) with verbalized understanding.  Access Code: [email or web address] - Half Kneeling Hip Flexor Stretch with Sidebend  - 2-3 x daily - 7 x weekly - 30-60sec hold - Seated Piriformis Stretch  - 2-3 x daily - 7 x weekly - 30-60sec hold - Seated Piriformis Stretch with Trunk Bend  - 2-3 x daily - 7 x weekly - 30-60sec hold   PATIENT EDUCATION:  Education details: Patient was educated on diagnosis, anatomy and pathology involved, prognosis, role of PT, and was given an HEP, demonstrating exercise with proper form following verbal  and  tactile cues, and was given a paper hand out to continue exercise at home. Pt was educated on and agreed to plan of care. Person educated: Patient Education method: Explanation, Demonstration, Verbal cues, and Handouts Education comprehension: verbalized understanding and returned demonstration   HOME EXERCISE PROGRAM: [email or web address]  ASSESSMENT:  CLINICAL IMPRESSION: Patient is a 21 y.o. male who was seen today for physical therapy evaluation and treatment for R hip pain. Lumbar rediculopathy ruled out through testing; signs and symptoms of R iliopsoas tendinosis- most likely from overuse with concordant decreased hip flexor strength/activation of LLE. Impairments in muscle length, pain, decreased hip ext and flexor strength, abnormal gait and posture. Activity limitations in forward bending, donning//doffing shoes, prolonged sitting, ambulation, and lifting;inhibiting full participation in his occupation for the [employer] and heavy ADLs. Would benefit from skilled PT to address above deficits and promote optimal return to PLOF.    OBJECTIVE IMPAIRMENTS Abnormal gait, decreased activity tolerance, decreased endurance, decreased mobility, difficulty walking, decreased ROM, decreased strength, increased fascial restrictions, increased muscle spasms, impaired flexibility, impaired tone, improper body mechanics, postural dysfunction, and pain.   ACTIVITY LIMITATIONS lifting, bending, sitting, stairs, and locomotion level  PARTICIPATION LIMITATIONS: driving, shopping, community activity, and occupation  PERSONAL FACTORS Fitness, Past/current experiences, Profession, Time since onset of injury/illness/exacerbation, and 1 comorbidity: chronic pain  are also affecting patient's functional outcome.   REHAB POTENTIAL: Good  CLINICAL DECISION MAKING: Evolving/moderate complexity  EVALUATION COMPLEXITY: Moderate   GOALS: Goals reviewed with patient? Yes  SHORT TERM GOALS: Target date: 07/26/2022  Pt  will be independent with HEP in order to improve strength and balance in order to decrease fall risk and improve function at home and work.  Baseline:06/27/22 HEP given  Goal status: INITIAL    LONG TERM GOALS: Target date: 08/23/2022  Patient will increase FOTO score to 30  to demonstrate predicted increase in functional mobility to complete ADLs  Baseline: 21 Goal status: INITIAL  2.  Pt will demonstrate 1RM leg press of at least 162# on LLE in order to demonstrate 90% power/strength of dominant  Baseline: 145# Goal status: INITIAL  3.  Pt will decrease worst pain as reported on NPRS by at least 3 points in order to demonstrate clinically significant reduction in pain.  Baseline: 9/10 Goal status: INITIAL   PLAN: PT FREQUENCY: 1-2x/week  PT DURATION: 8 weeks  PLANNED INTERVENTIONS: Therapeutic exercises, Therapeutic activity, Neuromuscular re-education, Balance training, Gait training, Patient/Family education, Self Care, Joint mobilization, Joint manipulation, Stair training, DME instructions, Dry Needling, Electrical stimulation, Cryotherapy, Moist heat, Taping, Traction, Ultrasound, Fluidotherapy, Manual therapy, and Re-evaluation.  PLAN FOR NEXT SESSION: HEP review  Hilda Lias DPT  Hilda Lias, PT 06/28/2022, 12:33 PM

## 2022-06-28 ENCOUNTER — Encounter: Payer: Self-pay | Admitting: Physical Therapy

## 2022-06-29 ENCOUNTER — Encounter: Payer: Self-pay | Admitting: Family Medicine

## 2022-06-29 ENCOUNTER — Ambulatory Visit: Admitting: Physical Therapy

## 2022-06-30 NOTE — Telephone Encounter (Signed)
Error

## 2022-07-04 ENCOUNTER — Encounter: Payer: Self-pay | Admitting: Physical Therapy

## 2022-07-04 ENCOUNTER — Ambulatory Visit: Attending: Physician Assistant | Admitting: Physical Therapy

## 2022-07-04 DIAGNOSIS — M545 Low back pain, unspecified: Secondary | ICD-10-CM | POA: Diagnosis present

## 2022-07-04 DIAGNOSIS — M25551 Pain in right hip: Secondary | ICD-10-CM | POA: Insufficient documentation

## 2022-07-04 DIAGNOSIS — G8929 Other chronic pain: Secondary | ICD-10-CM | POA: Diagnosis present

## 2022-07-04 NOTE — Therapy (Signed)
OUTPATIENT PHYSICAL THERAPY THORACOLUMBAR EVALUATION   Patient Name: Evan Leach MRN: [id number] DOB:06/06/2001, 21 y.o., male Today's Date: 07/04/2022   PT End of Session - 07/04/22 1152     Visit Number 2    Number of Visits 16    Date for PT Re-Evaluation 08/25/22    Authorization Type [employer] 2023    Authorization - Visit Number 2    Progress Note Due on Visit 10    PT Start Time 1130    PT Stop Time 1210    PT Time Calculation (min) 40 min    Activity Tolerance Patient tolerated treatment well    Behavior During Therapy WFL for tasks assessed/performed              Past Medical History:  Diagnosis Date   Anxiety    Depression    GERD (gastroesophageal reflux disease)    Past Surgical History:  Procedure Laterality Date   WISDOM TOOTH EXTRACTION     Patient Active Problem List   Diagnosis Date Noted   Strain of muscle of right groin region 04/07/2020   Acute right-sided low back pain with bilateral sciatica 04/07/2020   Right sided sciatica 04/07/2020   Right groin pain 04/07/2020    PCP: Lynn Ito PA  REFERRING PROVIDER: Lynn Ito PA  REFERRING DIAG: chronic R hip pain  Rationale for Evaluation and Treatment Rehabilitation  THERAPY DIAG:  Chronic bilateral low back pain, unspecified whether sciatica present  Pain in right hip  ONSET DATE: 4 years ago  SUBJECTIVE:                                                                                                                                                                                           SUBJECTIVE STATEMENT:  Patient reports his hip is feeling okay currently 4/10 more in the back than the hip. Reports minimal compliance with HEP, needs a new print out  PERTINENT HISTORY:  R hip pain that began 4 years after hiking with a large pack and running. Has had insidious worsening pain over past 2 years. Reports pain is the R side of low back and R glute; with L sided pain over the  past 2 years, not as bad as R. Reports pain is stabbing in the back and glute with burning pain down inside of each leg. Current pain 4/10; lowest 2/10; worst 9/10. Pain is aggravated by stair negotiation, walking on uneven surfaces (on even surfaces as well but not as much), being in a car for more than , and with forward bending. Pt is in the [employer], just coming back from deployment. Pt  has accommodations through [employer] d/t this pain. Would like to be able to walk and ride in the car without pain. Pt denies N/V, B&B changes, unexplained weight fluctuation, saddle paresthesia, fever, night sweats, or unrelenting night pain at this time.   PAIN:  Are you having pain? Yes: NPRS scale: 4/10 Pain location: R hip and LB Pain description: stabbing pain with occasional radiation down RLE Aggravating factors: stair negotiation, walking on uneven surfaces (on even surfaces as well but not as much), being in a car for more than , and with forward bending Relieving factors: none- pain medication with minimal help   PRECAUTIONS: None  WEIGHT BEARING RESTRICTIONS No  FALLS:  Has patient fallen in last 6 months? Yes. Number of falls 5 falls from pain, takes a step and that step is painful and makes him collapse in pain   LIVING ENVIRONMENT: Lives with: lives with their partner Lives in: House/apartment Stairs: Yes: Internal: 12 steps; on left going up Has following equipment at home: None  OCCUPATION: [employer]   PLOF: Independent  PATIENT GOALS walk and ride in car without pain    OBJECTIVE:   DIAGNOSTIC FINDINGS:  MRI 09/2021 IMPRESSION: 1. Minimal annular disc bulging at L3-4 through L5-S1 without stenosis or neural impingement. 2. Otherwise unremarkable and normal MRI of the lumbar spine  PATIENT SURVEYS:  FOTO 21 goal 30  SCREENING FOR RED FLAGS: Bowel or bladder incontinence: No Spinal tumors: No Cauda equina syndrome: No Compression fracture: No Abdominal aneurysm:  No  COGNITION:  Overall cognitive status: Within functional limits for tasks assessed     SENSATION: WFL  MUSCLE LENGTH: Hamstrings: Right 150 deg; Left WNL Thomas test: WNL for motion bilat with stretch, concordant groin pain at R hip  POSTURE: decreased lumbar lordosis  PALPATION: Concordant pain   LUMBAR ROM:   Active  A/PROM  eval  Flexion WNL with concordant R sided glute and lumbar pain   Extension WNL  Right lateral flexion WNL  Left lateral flexion WNL  Right rotation WNL with concordant ipsilateral pain   Left rotation WNL with concordant ipsilateral pain    (Blank rows = not tested)  LOWER EXTREMITY ROM:     All BLE ROM WNL  LOWER EXTREMITY MMT:     All BLE  SL squat L: 2 R: 3 with pain  1RM leg press L 145 R 180  LUMBAR SPECIAL TESTS:  Straight leg raise test: Negative, Slump test: Negative, FABER test: Positive, and Thomas test: Positive bilat; Cross traight leg raise negative bilat  FUNCTIONAL TESTS:  10 meter walk test: 1.53m/s  GAIT: Distance walked: 62m Assistive device utilized: None Level of assistance: Complete Independence Comments: Compensated trendlenberg; L lateral trunk lean throughout    TODAY'S TREATMENT  Nustep seat 9 L3  - Half Kneeling Hip Flexor Stretch with Sidebend 30sec - Seated Piriformis Stretch  30sec - Seated glute Stretch 30sec L SL squat 3x 6 (attempted on R side with physical ease- with pain) with better carry over of last set  Vertical Jump LLE: 10in R: 11in Triple Hop R: 28ft 3in L: 58ft 14in  Alt lateral jump 2x 12 (6 each direction) with encouragement for low land for increased power with good carry over  OMEGA leg press LLE 135# 3x 8  Thomas stretch 30sec   PATIENT EDUCATION:  Education details: therex form/technique Person educated: Patient Education method: Programmer, multimedia, Facilities manager, Verbal cues, and Handouts Education comprehension: verbalized understanding and returned  demonstration  HOME EXERCISE PROGRAM: [id number]  ASSESSMENT:  CLINICAL IMPRESSION: PT initiated therex progression for mobility and decreased tension of R hip flexor tension, and reciprocal inhibition of hip ext strengthening, and LLE hip flexor strengthening focus (decreased strength/power on tis side due to overuse of R) with success. Patient demonstrates decreased LLE power in triple hop test to support this. Pt is able to comply with all cuing for proper technique of therex with good effort and no increased pain throughout. Pt will continue to benefit from skilled PT to return to optimal PLOF.     OBJECTIVE IMPAIRMENTS Abnormal gait, decreased activity tolerance, decreased endurance, decreased mobility, difficulty walking, decreased ROM, decreased strength, increased fascial restrictions, increased muscle spasms, impaired flexibility, impaired tone, improper body mechanics, postural dysfunction, and pain.   ACTIVITY LIMITATIONS lifting, bending, sitting, stairs, and locomotion level  PARTICIPATION LIMITATIONS: driving, shopping, community activity, and occupation  PERSONAL FACTORS Fitness, Past/current experiences, Profession, Time since onset of injury/illness/exacerbation, and 1 comorbidity: chronic pain  are also affecting patient's functional outcome.   REHAB POTENTIAL: Good  CLINICAL DECISION MAKING: Evolving/moderate complexity  EVALUATION COMPLEXITY: Moderate   GOALS: Goals reviewed with patient? Yes  SHORT TERM GOALS: Target date: 08/01/2022  Pt will be independent with HEP in order to improve strength and balance in order to decrease fall risk and improve function at home and work.  Baseline:06/27/22 HEP given  Goal status: INITIAL    LONG TERM GOALS: Target date: 08/29/2022  Patient will increase FOTO score to 30  to demonstrate predicted increase in functional mobility to complete ADLs  Baseline: 21 Goal status: INITIAL  2.  Pt will demonstrate 1RM leg  press of at least 162# on LLE in order to demonstrate 90% power/strength of dominant  Baseline: 145# Goal status: INITIAL  3.  Pt will decrease worst pain as reported on NPRS by at least 3 points in order to demonstrate clinically significant reduction in pain.  Baseline: 9/10 Goal status: INITIAL  4.  Pt will demonstrate triple leg hop of LLE of at least 14ft in order to demonstrate 90% power of RLE Baseline: R: 68ft 3in L: 46ft 14in Goal status: INITIAL    PLAN: PT FREQUENCY: 1-2x/week  PT DURATION: 8 weeks  PLANNED INTERVENTIONS: Therapeutic exercises, Therapeutic activity, Neuromuscular re-education, Balance training, Gait training, Patient/Family education, Self Care, Joint mobilization, Joint manipulation, Stair training, DME instructions, Dry Needling, Electrical stimulation, Cryotherapy, Moist heat, Taping, Traction, Ultrasound, Fluidotherapy, Manual therapy, and Re-evaluation.  PLAN FOR NEXT SESSION: HEP review  Hilda Lias DPT  Hilda Lias, PT 07/04/2022, 2:58 PM

## 2022-07-06 ENCOUNTER — Encounter: Payer: Self-pay | Admitting: Physician Assistant

## 2022-07-06 ENCOUNTER — Ambulatory Visit: Admitting: Physical Therapy

## 2022-07-06 ENCOUNTER — Encounter: Payer: Self-pay | Admitting: Physical Therapy

## 2022-07-06 ENCOUNTER — Ambulatory Visit (INDEPENDENT_AMBULATORY_CARE_PROVIDER_SITE_OTHER): Admitting: Physician Assistant

## 2022-07-06 VITALS — BP 121/80 | HR 71 | Temp 98.3°F | Resp 16 | Ht 68.0 in | Wt 216.4 lb

## 2022-07-06 DIAGNOSIS — G4733 Obstructive sleep apnea (adult) (pediatric): Secondary | ICD-10-CM

## 2022-07-06 DIAGNOSIS — G8929 Other chronic pain: Secondary | ICD-10-CM

## 2022-07-06 DIAGNOSIS — F411 Generalized anxiety disorder: Secondary | ICD-10-CM | POA: Diagnosis not present

## 2022-07-06 DIAGNOSIS — M25551 Pain in right hip: Secondary | ICD-10-CM

## 2022-07-06 DIAGNOSIS — M545 Low back pain, unspecified: Secondary | ICD-10-CM | POA: Diagnosis not present

## 2022-07-06 DIAGNOSIS — F331 Major depressive disorder, recurrent, moderate: Secondary | ICD-10-CM

## 2022-07-06 NOTE — Progress Notes (Signed)
[facility] [address] [place], [place] [address]  Internal MEDICINE  Office Visit Note  Patient Name: Evan Leach  [id number]  [id number]  Date of Service: 07/07/2022  Chief Complaint  Patient presents with   Follow-up   Gastroesophageal Reflux   Depression   Anxiety   Hip Pain    Right hip pain - Scale 1-10: 8 level of pain at it's peak, level of 4 when least painful    HPI Pt is here for routine follow up -was seeing ortho and recommended PT and If not improving then would consider surgery. This was at [facility]. Doesn't think they take his insurance though and is in need of a new referral. Pain seems to be more frequent. Can bother him getting in and out of the car as well. Walking distance can really bother him and require him to take breaks and limp. Bending over is painful. He does state the pain can come and go and may come on suddenly. -Taking 800mg  ibuprofen daily and flexeril every other day.  [facility] and may potentially look into finding orthopedic surgeon through the , but states that he doesn't want to wait for that and would still like referral today -[facility] to [facility]. Reports they increased his effexor to 150mg  once per day instead of BID dosing of 75mg . Otherwise his meds have stayed the same. Follows up in 1 month. Will be starting therapy next month as well. Did discuss that he does not need two providers addressing and adjusting these medications, therefore he will [facility] the [facility] as they have [facility] as well. -on cpap now with [facility] and has been on this for about 2 months. Feels much better on it. Occasionally will have it on for awhile, but then wake up a few hours later. He doesn't think this is due to the cpap, but just his anxiety overall impacting his sleep and is going to work on this. Did discuss he could use melatonin to help sleep and can utilize his hydroxyzine as needed during the day but  also before bed to help with sleep too. He may discuss further with psych at the [facility] if not improving as additional medication adjustment may be indicated pending response. -He states he had initially declined shceduling follow up at [facility] in thinking he could follow up in our office, however no download is available to in the office and would still benefit from a follow up to check on this especially since compliance may be needed for insurance. He is going to call [facility] to follow up on this.  Current Medication: Outpatient Encounter Medications as of 07/06/2022  Medication Sig   buPROPion (WELLBUTRIN XL) 150 MG 24 hr tablet Take 1 tablet (150 mg total) by mouth daily.   cetirizine (ZYRTEC) 10 MG tablet Take 1 tablet (10 mg total) by mouth daily.   cyclobenzaprine (FLEXERIL) 5 MG tablet Take 1 tablet (5 mg total) by mouth at bedtime.   fluticasone (FLONASE) 50 MCG/ACT nasal spray Place 2 sprays into both nostrils in the morning and at bedtime.   hydrOXYzine (ATARAX/VISTARIL) 25 MG tablet Take by mouth.   ibuprofen (ADVIL) 800 MG tablet Take 1 tablet (800 mg total) by mouth every 8 (eight) hours as needed for mild pain.   indomethacin (INDOCIN) 25 MG capsule Take by mouth.   omeprazole (PRILOSEC) 40 MG capsule Take 1 capsule (40 mg total) by mouth daily.  ondansetron (ZOFRAN-ODT) 4 MG disintegrating tablet Take 1 tablet (4 mg total) by mouth every 6 (six) hours as needed for nausea or vomiting.   oxyCODONE-acetaminophen (PERCOCET) 5-325 MG tablet Take 2 tablets by mouth every 6 (six) hours as needed for severe pain.   venlafaxine (EFFEXOR) 75 MG tablet Take 1 tablet (75 mg total) by mouth 2 (two) times daily.   [DISCONTINUED] predniSONE (DELTASONE) 10 MG tablet Take 6 tablets (60 mg total) by mouth daily. Take 60 mg (6 tablets) x 2 days, then 50 mg (5 tablets) x 2 days, then 40 mg (4 tablets) x 2 days, then 30 mg (3 tablets) x 2 days, then 20 mg (2 tablets) x 2 days, then 10 mg (1 tablet) x 2 days then  STOP   No facility-administered encounter medications on file as of 07/06/2022.    Surgical History: Past Surgical History:  Procedure Laterality Date   WISDOM TOOTH EXTRACTION      Medical History: Past Medical History:  Diagnosis Date   Anxiety    Depression    GERD (gastroesophageal reflux disease)     Family History: Family History  Problem Relation Age of Onset   Diabetes Maternal Grandmother    Diabetes Maternal Grandfather    Diabetes Paternal Grandmother     Social History   Socioeconomic History   Marital status: Single    Spouse name: Not on file   Number of children: Not on file   Years of education: Not on file   Highest education level: Not on file  Occupational History   Not on file  Tobacco Use   Smoking status: Never   Smokeless tobacco: Never  Substance and Sexual Activity   Alcohol use: Yes    Comment: social   Drug use: Never   Sexual activity: Not on file  Other Topics Concern   Not on file  Social History Narrative   Not on file   Social Determinants of Health   Financial Resource Strain: Low Risk  (07/07/2022)   Overall Financial Resource Strain (CARDIA)    Difficulty of Paying Living Expenses: Not hard at all  Food Insecurity: No Food Insecurity (07/07/2022)   Hunger Vital Sign    Worried About Running Out of Food in the Last Year: Never true    Ran Out of Food in the Last Year: Never true  Transportation Needs: No Transportation Needs (07/07/2022)   PRAPARE - Administrator, Civil Service (Medical): No    Lack of Transportation (Non-Medical): No  Physical Activity: Not on file  Stress: Not on file  Social Connections: Not on file  Intimate Partner Violence: Not At Risk (07/07/2022)   Humiliation, Afraid, Rape, and Kick questionnaire    Fear of Current or Ex-Partner: No    Emotionally Abused: No    Physically Abused: No    Sexually Abused: No      Review of Systems  Constitutional:  Negative for chills and unexpected  weight change.  HENT:  Negative for congestion, postnasal drip, rhinorrhea, sneezing and sore throat.   Eyes:  Negative for redness.  Respiratory:  Negative for cough, chest tightness and shortness of breath.   Cardiovascular:  Negative for chest pain and palpitations.  Gastrointestinal:  Negative for abdominal pain, constipation, diarrhea, nausea and vomiting.  Genitourinary:  Negative for dysuria.  Musculoskeletal:  Positive for arthralgias and back pain. Negative for joint swelling and neck pain.  Skin:  Negative for rash.  Neurological:  Positive for numbness.  Negative for tremors.  Hematological:  Negative for adenopathy. Does not bruise/bleed easily.  Psychiatric/Behavioral:  Positive for behavioral problems (Depression) and sleep disturbance. Negative for suicidal ideas. The patient is nervous/anxious.     Vital Signs: BP 121/80   Pulse 71   Temp 98.3 F (36.8 C)   Resp 16   Ht 5\' 8"  (1.727 m)   Wt 216 lb 6.4 oz (98.2 kg)   SpO2 99%   BMI 32.90 kg/m    Physical Exam Vitals and nursing note reviewed.  Constitutional:      General: He is not in acute distress.    Appearance: He is well-developed. He is not diaphoretic.  HENT:     Head: Normocephalic and atraumatic.     Mouth/Throat:     Pharynx: No oropharyngeal exudate.  Eyes:     Pupils: Pupils are equal, round, and reactive to light.  Neck:     Thyroid: No thyromegaly.     Vascular: No JVD.     Trachea: No tracheal deviation.  Cardiovascular:     Rate and Rhythm: Normal rate and regular rhythm.     Heart sounds: Normal heart sounds. No murmur heard.    No friction rub. No gallop.  Pulmonary:     Effort: Pulmonary effort is normal. No respiratory distress.     Breath sounds: No wheezing or rales.  Chest:     Chest wall: No tenderness.  Abdominal:     General: Bowel sounds are normal. There is no distension.     Palpations: Abdomen is soft.     Tenderness: There is no guarding.  Musculoskeletal:      Cervical back: Normal range of motion and neck supple.  Lymphadenopathy:     Cervical: No cervical adenopathy.  Skin:    General: Skin is warm and dry.  Neurological:     Mental Status: He is alert and oriented to person, place, and time.     Cranial Nerves: No cranial nerve deficit.  Psychiatric:        Behavior: Behavior normal.        Thought Content: Thought content normal.        Judgment: Judgment normal.        Assessment/Plan: 1. Chronic right hip pain Patient currently undergoing PT and will continue, however needs new ortho surgery referral that takes his insurance since hip pain is worsening. Will send new referral unless patient is able to [facility] the [facility] for this first - Ambulatory referral to Orthopedic Surgery  2. OSA (obstructive sleep apnea) Continue cpap nightly, does need download to check apnea is controlled and for compliance and will call [facility] for follow up on this  3. GAD (generalized anxiety disorder) Followed [facility] now  4. Moderate episode of recurrent major depressive disorder (HCC) Followed [facility] now   General Counseling: ala capri understanding of the findings of todays visit and agrees with plan of treatment. I have discussed any further diagnostic evaluation that may be needed or ordered today. We also reviewed his medications today. he has been encouraged to call the office with any questions or concerns that should arise related to todays visit.    Orders Placed This Encounter  Procedures   Ambulatory referral to Orthopedic Surgery    No orders of the defined types were placed in this encounter.   This patient was seen by Wallis Bamberg, PA-C in collaboration with Dr. Lynn Ito as a part  of collaborative care agreement.   Total time spent:30 Minutes Time spent includes review of chart, medications, test results, and follow up plan with the patient.      Dr Lyndon Code Internal  medicine

## 2022-07-06 NOTE — Therapy (Signed)
OUTPATIENT PHYSICAL THERAPY THORACOLUMBAR EVALUATION   Patient Name: Evan Leach MRN: [id number] DOB:07/14/2001, 21 y.o., male Today's Date: 07/06/2022   PT End of Session - 07/06/22 1141     Visit Number 3    Number of Visits 16    Date for PT Re-Evaluation 08/25/22    Authorization Type [employer] [id number]    Authorization - Visit Number 3    Progress Note Due on Visit 10    PT Start Time 1131    PT Stop Time 1209    PT Time Calculation (min) 38 min    Activity Tolerance Patient tolerated treatment well    Behavior During Therapy WFL for tasks assessed/performed               Past Medical History:  Diagnosis Date   Anxiety    Depression    GERD (gastroesophageal reflux disease)    Past Surgical History:  Procedure Laterality Date   WISDOM TOOTH EXTRACTION     Patient Active Problem List   Diagnosis Date Noted   Strain of muscle of right groin region 04/07/2020   Acute right-sided low back pain with bilateral sciatica 04/07/2020   Right sided sciatica 04/07/2020   Right groin pain 04/07/2020    PCP: Lynn Ito PA  REFERRING PROVIDER: Lynn Ito PA  REFERRING DIAG: chronic R hip pain  Rationale for Evaluation and Treatment Rehabilitation  THERAPY DIAG:  Chronic bilateral low back pain, unspecified whether sciatica present  Pain in right hip  ONSET DATE: 4 years ago  SUBJECTIVE:                                                                                                                                                                                           SUBJECTIVE STATEMENT:  Patient reports 4/10 R groin pain today. He has been very sore since last visit. Compliance with HEP without concern.   PERTINENT HISTORY:  R hip pain that began 4 years after hiking with a large pack and running. Has had insidious worsening pain over past 2 years. Reports pain is the R side of low back and R glute; with L sided pain over the past 2 years, not as bad  as R. Reports pain is stabbing in the back and glute with burning pain down inside of each leg. Current pain 4/10; lowest 2/10; worst 9/10. Pain is aggravated by stair negotiation, walking on uneven surfaces (on even surfaces as well but not as much), being in a car for more than , and with forward bending. 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 Would like to be able to walk and ride in the car without pain. Pt denies N/V, B&B changes, unexplained weight fluctuation, saddle paresthesia, fever, night sweats, or unrelenting night pain at this time.   PAIN:  Are you having pain? Yes: NPRS scale: 4/10 Pain location: R hip and LB Pain description: stabbing pain with occasional radiation down RLE Aggravating factors: stair negotiation, walking on uneven surfaces (on even surfaces as well but not as much), being in a car for more than , and with forward bending Relieving factors: none- pain medication with minimal help   PRECAUTIONS: None  WEIGHT BEARING RESTRICTIONS No  FALLS:  Has patient fallen in last 6 months? Yes. Number of falls 5 falls from pain, takes a step and that step is painful and makes him collapse in pain   LIVING ENVIRONMENT: Lives with: lives with their partner Lives in: House/apartment Stairs: Yes: Internal: 12 steps; on left going up Has following equipment at home: None  OCCUPATION: [employer]   PLOF: Independent  PATIENT GOALS walk and ride in car without pain    OBJECTIVE:   DIAGNOSTIC FINDINGS:  MRI 09/2021 IMPRESSION: 1. Minimal annular disc bulging at L3-4 through L5-S1 without stenosis or neural impingement. 2. Otherwise unremarkable and normal MRI of the lumbar spine  PATIENT SURVEYS:  FOTO 21 goal 30  SCREENING FOR RED FLAGS: Bowel or bladder incontinence: No Spinal tumors: No Cauda equina syndrome: No Compression fracture: No Abdominal aneurysm:  No  COGNITION:  Overall cognitive status: Within functional limits for tasks assessed     SENSATION: WFL  MUSCLE LENGTH: Hamstrings: Right 150 deg; Left WNL Thomas test: WNL for motion bilat with stretch, concordant groin pain at R hip  POSTURE: decreased lumbar lordosis  PALPATION: Concordant pain   LUMBAR ROM:   Active  A/PROM  eval  Flexion WNL with concordant R sided glute and lumbar pain   Extension WNL  Right lateral flexion WNL  Left lateral flexion WNL  Right rotation WNL with concordant ipsilateral pain   Left rotation WNL with concordant ipsilateral pain    (Blank rows = not tested)  LOWER EXTREMITY ROM:     All BLE ROM WNL  LOWER EXTREMITY MMT:     All BLE  SL squat L: 2 R: 3 with pain  1RM leg press L 145 R 180  LUMBAR SPECIAL TESTS:  Straight leg raise test: Negative, Slump test: Negative, FABER test: Positive, and Thomas test: Positive bilat; Cross traight leg raise negative bilat  FUNCTIONAL TESTS:  10 meter walk test: 1.58m/s  GAIT: Distance walked: 71m Assistive device utilized: None Level of assistance: Complete Independence Comments: Compensated trendlenberg; L lateral trunk lean throughout    TODAY'S TREATMENT  Nustep seat 9 L3   L SL squat 3x 8 (attempted on R side with physical ease- with pain) with better carry over of last set  L step up onto 12in step with eccentric heel tap to lower 3x 8   L OMEGA leg press LLE 135# 3x 8  - Half Kneeling Hip Flexor Stretch with Sidebend X6 5SEC HOLD Thomas stretch 30sec    PATIENT EDUCATION:  Education details: therex form/technique Person educated: Patient Education method: Programmer, multimedia, Facilities manager, Verbal cues, and Handouts Education comprehension: verbalized understanding and returned demonstration   HOME EXERCISE PROGRAM: [id number]  ASSESSMENT:  CLINICAL IMPRESSION: PT continued therex progression for mobility and decreased tension of R hip flexor tension, and  reciprocal inhibition of hip ext strengthening,  and LLE hip flexor strengthening focus (decreased strength/power on tis side due to overuse of R) with success. Pt is able to comply with all cuing for proper technique of therex with good effort and no increased pain throughout. Pt will continue to benefit from skilled PT to return to optimal PLOF.     OBJECTIVE IMPAIRMENTS Abnormal gait, decreased activity tolerance, decreased endurance, decreased mobility, difficulty walking, decreased ROM, decreased strength, increased fascial restrictions, increased muscle spasms, impaired flexibility, impaired tone, improper body mechanics, postural dysfunction, and pain.   ACTIVITY LIMITATIONS lifting, bending, sitting, stairs, and locomotion level  PARTICIPATION LIMITATIONS: driving, shopping, community activity, and occupation  [place], Past/current experiences, Profession, Time since onset of injury/illness/exacerbation, and 1 comorbidity: chronic pain  are also affecting patient's functional outcome.   REHAB POTENTIAL: Good  CLINICAL DECISION MAKING: Evolving/moderate complexity  EVALUATION COMPLEXITY: Moderate   GOALS: Goals reviewed with patient? Yes  SHORT TERM GOALS: Target date: 08/03/2022  Pt will be independent with HEP in order to improve strength and balance in order to decrease fall risk and improve function at home and work.  Baseline:06/27/22 HEP given  Goal status: INITIAL    LONG TERM GOALS: Target date: 08/31/2022  Patient will increase FOTO score to 30  to demonstrate predicted increase in functional mobility to complete ADLs  Baseline: 21 Goal status: INITIAL  2.  Pt will demonstrate 1RM leg press of at least 162# on LLE in order to demonstrate 90% power/strength of dominant  Baseline: 145# Goal status: INITIAL  3.  Pt will decrease worst pain as reported on NPRS by at least 3 points in order to demonstrate clinically significant reduction in pain.   Baseline: 9/10 Goal status: INITIAL  4.  Pt will demonstrate triple leg hop of LLE of at least 51ft in order to demonstrate 90% power of RLE Baseline: R: 23ft 3in L: 24ft 14in Goal status: INITIAL    PLAN: PT FREQUENCY: 1-2x/week  PT DURATION: 8 weeks  PLANNED INTERVENTIONS: Therapeutic exercises, Therapeutic activity, Neuromuscular re-education, Balance training, Gait training, Patient/Family education, Self Care, Joint mobilization, Joint manipulation, Stair training, DME instructions, Dry Needling, Electrical stimulation, Cryotherapy, Moist heat, Taping, Traction, Ultrasound, Fluidotherapy, Manual therapy, and Re-evaluation.  PLAN FOR NEXT SESSION: HEP review  Durwin Reges DPT  Durwin Reges, PT 07/06/2022, 2:44 PM

## 2022-07-07 ENCOUNTER — Ambulatory Visit (INDEPENDENT_AMBULATORY_CARE_PROVIDER_SITE_OTHER): Payer: Self-pay | Admitting: Family Medicine

## 2022-07-07 ENCOUNTER — Encounter: Payer: Self-pay | Admitting: Family Medicine

## 2022-07-07 VITALS — BP 110/82 | HR 79 | Ht 68.0 in | Wt 216.0 lb

## 2022-07-07 DIAGNOSIS — M25551 Pain in right hip: Secondary | ICD-10-CM

## 2022-07-07 DIAGNOSIS — M5117 Intervertebral disc disorders with radiculopathy, lumbosacral region: Secondary | ICD-10-CM | POA: Insufficient documentation

## 2022-07-07 DIAGNOSIS — G8929 Other chronic pain: Secondary | ICD-10-CM | POA: Insufficient documentation

## 2022-07-07 MED ORDER — DICLOFENAC SODIUM 75 MG PO TBEC: 75.0000 mg | DELAYED_RELEASE_TABLET | Freq: Two times a day (BID) | ORAL | 0 refills | Status: DC

## 2022-07-07 MED ORDER — GABAPENTIN 300 MG PO CAPS: 300.0000 mg | ORAL_CAPSULE | Freq: Every day | ORAL | 0 refills | Status: AC

## 2022-07-07 MED ORDER — DEXAMETHASONE 4 MG PO TABS: ORAL_TABLET | ORAL | 0 refills | Status: DC

## 2022-07-07 NOTE — Assessment & Plan Note (Addendum)
Patient roughly 2-year history of acute on chronic low back pain with symptoms radiating to right greater than left leg, denies any trauma but did have significant strenuous activity while in the [employer].  Does have comorbid right hip joint symptoms described elsewhere.  His examination is significant for positive straight leg raise, positive stressing throughout the iliopsoas, additional hip findings detailed in the note.  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  He will return to physical therapy, new referral placed today.

## 2022-07-07 NOTE — Assessment & Plan Note (Signed)
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  Of note, he has had an intra-articular right hip joint injection earlier this year which provided roughly 3 months of response, following the 3 months pain rapidly recurred.  Examination today reveals positive straight leg raise bilaterally, acutely positive FADIR which recreates his stated symptoms, negative FABER, negative piriformis, isolated psoas tendon testing localizes pain to the groin, minimally tender at the greater trochanteric region, examination otherwise benign.  History and findings including reviewed x-rays do show subtle degenerative component at the femoroacetabular articulation, cannot exclude soft tissue involvement such as labral derangement.  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  I have advised patient of the same as well as treatment strategies, will go on a course of oral Decadron, followed by scheduled diclofenac, nightly gabapentin, and we will proceed with MR arthrogram given his clinical course.  New referral to PT placed today.  He will return for follow-up reevaluation and we will determine next steps pending results and response to treatment.

## 2022-07-07 NOTE — Progress Notes (Signed)
Primary Care / Sports Medicine Office Visit  Patient Information:  Patient ID: Evan Leach, male DOB: [birth year] Age: 21 y.o. MRN: [id number]   Evan Leach is a pleasant 21 y.o. 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  No results found.   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  Does have comorbid right hip joint symptoms described elsewhere.  His examination is significant for positive straight leg raise, positive stressing throughout the iliopsoas, additional hip findings detailed in the note.  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  He will return to physical therapy, new referral placed today.      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  Of note, he has had an intra-articular right hip joint injection earlier this year which provided roughly 3 months of response, following the 3 months pain rapidly recurred.  Examination today reveals positive straight leg raise bilaterally, acutely positive FADIR which recreates his stated symptoms, negative FABER, negative piriformis, isolated psoas tendon testing localizes pain to the groin, minimally tender at the greater trochanteric region, examination otherwise benign.  History and findings including reviewed x-rays do show subtle degenerative component at the femoroacetabular articulation, cannot exclude soft tissue involvement such as labral derangement.  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  I have advised patient of the same as well as treatment strategies, will go on a course of oral Decadron, followed by scheduled diclofenac, nightly gabapentin, and we will proceed with MR arthrogram given his clinical course.  New referral to PT placed today.  He will return for follow-up reevaluation and we will determine next steps pending results and response to treatment.      Relevant Medications   dexamethasone (DECADRON) 4 MG tablet   gabapentin (NEURONTIN) 300 MG capsule   diclofenac (VOLTAREN) 75 MG EC tablet   Other Relevant Orders   Ambulatory referral to Physical Therapy   DG Arthro Hip Right      Orders & Medications Meds ordered this encounter  Medications   dexamethasone (DECADRON) 4 MG tablet    Sig: Take 2 tablets (8 mg) by mouth for one dose, then continue with 1 tablet 4 times per day afterwards for a total course of 5 days.    Dispense:  21 tablet    Refill:  0   gabapentin (NEURONTIN) 300 MG capsule    Sig: Take 1 capsule (300 mg total) by mouth at bedtime.    Dispense:  45 capsule    Refill:  0   diclofenac (VOLTAREN) 75 MG EC tablet    Sig: Take 1 tablet (75 mg total) by mouth 2 (two) times daily.    Dispense:  90 tablet    Refill:  0   Orders Placed This Encounter  Procedures   DG Arthro Hip Right   Ambulatory referral to Physical Therapy     Return in about 6 weeks (around 08/18/2022).     Jerrol Banana, MD   Primary Care Sports Medicine [facility]

## 2022-07-07 NOTE — Patient Instructions (Addendum)
-   Dose Decadron for full course - After Decadron complete, start diclofenac twice daily with food - Dose likely gabapentin - Can continue muscle relaxer and pain medication for additional symptom control - Continue with physical therapy, new referral provided - Referral coordinator will contact you to schedule MR arthrogram of the right hip - Return for follow-up as scheduled in 6 weeks

## 2022-07-11 ENCOUNTER — Ambulatory Visit: Admitting: Physical Therapy

## 2022-07-11 ENCOUNTER — Encounter: Payer: Self-pay | Admitting: Physical Therapy

## 2022-07-11 DIAGNOSIS — M25551 Pain in right hip: Secondary | ICD-10-CM

## 2022-07-11 DIAGNOSIS — G8929 Other chronic pain: Secondary | ICD-10-CM

## 2022-07-11 DIAGNOSIS — M545 Low back pain, unspecified: Secondary | ICD-10-CM | POA: Diagnosis not present

## 2022-07-11 NOTE — Therapy (Signed)
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 07/07/2022   Strain of muscle of right groin region 04/07/2020   Acute right-sided low back pain with bilateral sciatica 04/07/2020   Right sided sciatica 04/07/2020   Right groin pain 04/07/2020    PCP: Lynn Ito PA  REFERRING PROVIDER: Lynn Ito PA  REFERRING DIAG: chronic R hip pain  Rationale for Evaluation and Treatment Rehabilitation  THERAPY DIAG:  Chronic bilateral low back pain, unspecified whether sciatica present  Pain in right hip  ONSET DATE: 4 years ago  SUBJECTIVE:                                                                                                                                                                                           SUBJECTIVE STATEMENT:  Pt reports increased R groin pain since starting PT. Has not completed his stretches since Friday. Started steroid taper for this, reports it is not helping his pain, reporting 9/10 pain today. He reports having a fall yesterday due to the pain, collapsing to the floor, reports no injury from this. Endorses suicidal ideation with pain over the weekend. Reports he is being followed for this, denies need for additional resources at this time.   PERTINENT HISTORY:  R hip pain that began 4 years after hiking with a large pack and running. Has had insidious worsening pain over past 2 years. Reports pain is the R side of low back and R glute; with L sided pain over the past 2  years, not as bad as R. Reports pain is stabbing in the back and glute with burning pain down inside of each leg. Current pain 4/10; lowest 2/10; worst 9/10. Pain is aggravated by stair negotiation, walking on uneven surfaces (on even surfaces as well but not as much), being in a car for more than , and with forward bending. 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 Would like to be able to walk and ride in the car without pain. Pt denies N/V, B&B changes, unexplained weight fluctuation, saddle paresthesia, fever, night sweats, or  unrelenting night pain at this time.   PAIN:  Are you having pain? Yes: NPRS scale: 4/10 Pain location: R hip and LB Pain description: stabbing pain with occasional radiation down RLE Aggravating factors: stair negotiation, walking on uneven surfaces (on even surfaces as well but not as much), being in a car for more than , and with forward bending Relieving factors: none- pain medication with minimal help   PRECAUTIONS: None  WEIGHT BEARING RESTRICTIONS No  FALLS:  Has patient fallen in last 6 months? Yes. Number of falls 5 falls from pain, takes a step and that step is painful and makes him collapse in pain   LIVING ENVIRONMENT: Lives with: lives with their partner Lives in: House/apartment Stairs: Yes: Internal: 12 steps; on left going up Has following equipment at home: None  OCCUPATION: [employer]   PLOF: Independent  PATIENT GOALS walk and ride in car without pain    OBJECTIVE:   DIAGNOSTIC FINDINGS:  MRI 09/2021 IMPRESSION: 1. Minimal annular disc bulging at L3-4 through L5-S1 without stenosis or neural impingement. 2. Otherwise unremarkable and normal MRI of the lumbar spine  PATIENT SURVEYS:  FOTO 21 goal 30  SCREENING FOR RED FLAGS: Bowel or bladder incontinence: No Spinal tumors: No Cauda equina syndrome: No Compression fracture: No Abdominal aneurysm:  No  COGNITION:  Overall cognitive status: Within functional limits for tasks assessed     SENSATION: WFL  MUSCLE LENGTH: Hamstrings: Right 150 deg; Left WNL Thomas test: WNL for motion bilat with stretch, concordant groin pain at R hip  POSTURE: decreased lumbar lordosis  PALPATION: Concordant pain   LUMBAR ROM:   Active  A/PROM  eval  Flexion WNL with concordant R sided glute and lumbar pain   Extension WNL  Right lateral flexion WNL  Left lateral flexion WNL  Right rotation WNL with concordant ipsilateral pain   Left rotation WNL with concordant ipsilateral pain    (Blank rows = not tested)  LOWER EXTREMITY ROM:     All BLE ROM WNL  LOWER EXTREMITY MMT:     All BLE  SL squat L: 2 R: 3 with pain  1RM leg press L 145 R 180  LUMBAR SPECIAL TESTS:  Straight leg raise test: Negative, Slump test: Negative, FABER test: Positive, and Thomas test: Positive bilat; Cross traight leg raise negative bilat  FUNCTIONAL TESTS:  10 meter walk test: 1.34m/s  GAIT: Distance walked: 8m Assistive device utilized: None Level of assistance: Complete Independence Comments: Compensated trendlenberg; L lateral trunk lean throughout    TODAY'S TREATMENT  Therex  Nustep seat 9 L3   Hip distraction with movement in half kneeling x12 2-3sec hold  [employer]; articulating bridge x12 with good carry over of sequencing following demo and cuing;   Half kneeling with overhead reach x12; 30sec   HEP review   Manual   Long axis distraction 10x 10sec hold/10sec relax  Distraction with theraball with alt IR <> ER rotation bilat hips x20 Tomas stretch x45sec  Pain 3/10 following  PATIENT EDUCATION:  Education details: therex form/technique Person educated: Patient Education method: Programmer, multimedia, Facilities manager, Verbal cues, and Handouts Education comprehension: verbalized understanding and returned demonstration   HOME EXERCISE PROGRAM: [id number]  ASSESSMENT:  CLINICAL  IMPRESSION: PT utilized manual techniques for pain reduction with success (pain from 8/10 to 3/10). PT continued therex progression for increased mobility and lumbopelvic dissociation with patient able to comply with all cuing for proper technique of therex. Pt does endorse suicidal ideation following  hip pain over the weekend, but is well resources for this through the [place]- denies need for further resources/interventions Pt will continue to benefit from skilled PT to return to optimal PLOF.     OBJECTIVE IMPAIRMENTS Abnormal gait, decreased activity tolerance, decreased endurance, decreased mobility, difficulty walking, decreased ROM, decreased strength, increased fascial restrictions, increased muscle spasms, impaired flexibility, impaired tone, improper body mechanics, postural dysfunction, and pain.   ACTIVITY LIMITATIONS lifting, bending, sitting, stairs, and locomotion level  PARTICIPATION LIMITATIONS: driving, shopping, community activity, and occupation  PERSONAL FACTORS Fitness, Past/current experiences, Profession, Time since onset of injury/illness/exacerbation, and 1 comorbidity: chronic pain  are also affecting patient's functional outcome.   REHAB POTENTIAL: Good  CLINICAL DECISION MAKING: Evolving/moderate complexity  EVALUATION COMPLEXITY: Moderate   GOALS: Goals reviewed with patient? Yes  SHORT TERM GOALS: Target date: 08/10/2022  Pt will be independent with HEP in order to improve strength and balance in order to decrease fall risk and improve function at home and work.  Baseline:06/27/22 HEP given  Goal status: INITIAL    LONG TERM GOALS: Target date: 09/07/2022  Patient will increase FOTO score to 30  to demonstrate predicted increase in functional mobility to complete ADLs  Baseline: 21 Goal status: INITIAL  2.  Pt will demonstrate 1RM leg press of at least 162# on LLE in order to demonstrate 90% power/strength of dominant  Baseline: 145# Goal status:  INITIAL  3.  Pt will decrease worst pain as reported on NPRS by at least 3 points in order to demonstrate clinically significant reduction in pain.  Baseline: 9/10 Goal status: INITIAL  4.  Pt will demonstrate triple leg hop of LLE of at least 33ft in order to demonstrate 90% power of RLE Baseline: R: 107ft 3in L: 96ft 14in Goal status: INITIAL    PLAN: PT FREQUENCY: 1-2x/week  PT DURATION: 8 weeks  PLANNED INTERVENTIONS: Therapeutic exercises, Therapeutic activity, Neuromuscular re-education, Balance training, Gait training, Patient/Family education, Self Care, Joint mobilization, Joint manipulation, Stair training, DME instructions, Dry Needling, Electrical stimulation, Cryotherapy, Moist heat, Taping, Traction, Ultrasound, Fluidotherapy, Manual therapy, and Re-evaluation.  PLAN FOR NEXT SESSION: HEP review  Hilda Lias DPT  Hilda Lias, PT 07/13/2022, 8:46 AM

## 2022-07-13 ENCOUNTER — Ambulatory Visit: Admitting: Physical Therapy

## 2022-07-18 ENCOUNTER — Ambulatory Visit: Admitting: Physical Therapy

## 2022-07-18 ENCOUNTER — Encounter: Payer: Self-pay | Admitting: Physical Therapy

## 2022-07-18 DIAGNOSIS — M25551 Pain in right hip: Secondary | ICD-10-CM

## 2022-07-18 DIAGNOSIS — G8929 Other chronic pain: Secondary | ICD-10-CM

## 2022-07-18 DIAGNOSIS — M545 Low back pain, unspecified: Secondary | ICD-10-CM | POA: Diagnosis not present

## 2022-07-18 NOTE — Therapy (Signed)
OUTPATIENT PHYSICAL THERAPY THORACOLUMBAR TREATMENT   Patient Name: Evan Leach MRN: [id number] DOB:01/22/2001, 21 y.o., male Today's Date: 07/18/2022   PT End of Session - 07/18/22 1008     Visit Number 5    Number of Visits 16    Date for PT Re-Evaluation 08/25/22    Authorization Type [employer] 2023    Authorization - Visit Number 5    Progress Note Due on Visit 10    PT Start Time 1004    PT Stop Time 1045    PT Time Calculation (min) 41 min    Activity Tolerance Patient tolerated treatment well    Behavior During Therapy WFL for tasks assessed/performed                Past Medical History:  Diagnosis Date   Anxiety    Depression    GERD (gastroesophageal reflux disease)    Past Surgical History:  Procedure Laterality Date   WISDOM TOOTH EXTRACTION     Patient Active Problem List   Diagnosis Date Noted   Chronic pain of right hip 07/07/2022   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 07/07/2022   Strain of muscle of right groin region 04/07/2020   Acute right-sided low back pain with bilateral sciatica 04/07/2020   Right sided sciatica 04/07/2020   Right groin pain 04/07/2020    PCP: Lynn Ito PA  REFERRING PROVIDER: Lynn Ito PA  REFERRING DIAG: chronic R hip pain  Rationale for Evaluation and Treatment Rehabilitation  THERAPY DIAG:  Chronic bilateral low back pain, unspecified whether sciatica present  Pain in right hip  ONSET DATE: 4 years ago  SUBJECTIVE:                                                                                                                                                                                           SUBJECTIVE STATEMENT:  Pt reports increased R hip and knee pain following shopping this weekend. Feels better today, reporting 3/10 pain today. Had pain on getting out of the car after a lot of driving.   PERTINENT HISTORY:  R hip pain that began 4 years after  hiking with a large pack and running. Has had insidious worsening pain over past 2 years. Reports pain is the R side of low back and R glute; with L sided pain over the past 2 years, not as bad as R. Reports pain is stabbing in the back and glute with burning pain down inside of each leg. Current pain 4/10; lowest 2/10; worst 9/10. Pain is aggravated by stair negotiation, walking on uneven surfaces (on even surfaces as  well but not as much), being in a car for more than , and with forward bending. 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 Would like to be able to walk and ride in the car without pain. Pt denies N/V, B&B changes, unexplained weight fluctuation, saddle paresthesia, fever, night sweats, or unrelenting night pain at this time.   PAIN:  Are you having pain? Yes: NPRS scale: 4/10 Pain location: R hip and LB Pain description: stabbing pain with occasional radiation down RLE Aggravating factors: stair negotiation, walking on uneven surfaces (on even surfaces as well but not as much), being in a car for more than , and with forward bending Relieving factors: none- pain medication with minimal help   PRECAUTIONS: None  WEIGHT BEARING RESTRICTIONS No  FALLS:  Has patient fallen in last 6 months? Yes. Number of falls 5 falls from pain, takes a step and that step is painful and makes him collapse in pain   LIVING ENVIRONMENT: Lives with: lives with their partner Lives in: House/apartment Stairs: Yes: Internal: 12 steps; on left going up Has following equipment at home: None  OCCUPATION: [employer]   PLOF: Independent  PATIENT GOALS walk and ride in car without pain    OBJECTIVE:   DIAGNOSTIC FINDINGS:  MRI 09/2021 IMPRESSION: 1. Minimal annular disc bulging at L3-4 through L5-S1 without stenosis or neural impingement. 2. Otherwise unremarkable and normal MRI of the lumbar spine  PATIENT SURVEYS:   FOTO 21 goal 30  SCREENING FOR RED FLAGS: Bowel or bladder incontinence: No Spinal tumors: No Cauda equina syndrome: No Compression fracture: No Abdominal aneurysm: No  COGNITION:  Overall cognitive status: Within functional limits for tasks assessed     SENSATION: WFL  MUSCLE LENGTH: Hamstrings: Right 150 deg; Left WNL Thomas test: WNL for motion bilat with stretch, concordant groin pain at R hip  POSTURE: decreased lumbar lordosis  PALPATION: Concordant pain   LUMBAR ROM:   Active  A/PROM  eval  Flexion WNL with concordant R sided glute and lumbar pain   Extension WNL  Right lateral flexion WNL  Left lateral flexion WNL  Right rotation WNL with concordant ipsilateral pain   Left rotation WNL with concordant ipsilateral pain    (Blank rows = not tested)  LOWER EXTREMITY ROM:     All BLE ROM WNL  LOWER EXTREMITY MMT:     All BLE  SL squat L: 2 R: 3 with pain  1RM leg press L 145 R 180  LUMBAR SPECIAL TESTS:  Straight leg raise test: Negative, Slump test: Negative, FABER test: Positive, and Thomas test: Positive bilat; Cross traight leg raise negative bilat  FUNCTIONAL TESTS:  10 meter walk test: 1.78m/s  GAIT: Distance walked: 62m Assistive device utilized: None Level of assistance: Complete Independence Comments: Compensated trendlenberg; L lateral trunk lean throughout    TODAY'S TREATMENT  Therex  Nustep seat 9 L3   L SL squat 3x 10 from slightly elevated mat table with good carry over of technique  L reverse lunge with R foot on slider 2x 12 with good carry over of initial demo    L step up onto 12in step with eccentric heel tap to lower 2x 8 with heavy cuing for technique with good carry over    Lower trunk rotations x20 2sec hold - Half Kneeling Hip Flexor Stretch with Sidebend X6 5SEC HOLD Thomas stretch 2x 30sec  PATIENT EDUCATION:  Education details: therex form/technique Person educated: Patient Education method:  Consulting civil engineer, Demonstration, Verbal cues, and Handouts Education comprehension: verbalized understanding and returned demonstration   HOME EXERCISE PROGRAM: [email or web address]  ASSESSMENT:  CLINICAL IMPRESSION: In lieu of pain, PT progressed therex for increased L hip activation/strengthening and R hip mobility with success. Patient is able to demonstrate increased depth of L SL squat and decreased RLE use in LLE step up, evidencing increasing LLE strength. Patient is able to comply with all cuing for proper technique of therex with good effort throughout session. Pt will continue to benefit from skilled PT to return to optimal PLOF.     OBJECTIVE IMPAIRMENTS Abnormal gait, decreased activity tolerance, decreased endurance, decreased mobility, difficulty walking, decreased ROM, decreased strength, increased fascial restrictions, increased muscle spasms, impaired flexibility, impaired tone, improper body mechanics, postural dysfunction, and pain.   ACTIVITY LIMITATIONS lifting, bending, sitting, stairs, and locomotion level  PARTICIPATION LIMITATIONS: driving, shopping, community activity, and occupation  [place], Past/current experiences, Profession, Time since onset of injury/illness/exacerbation, and 1 comorbidity: chronic pain  are also affecting patient's functional outcome.   REHAB POTENTIAL: Good  CLINICAL DECISION MAKING: Evolving/moderate complexity  EVALUATION COMPLEXITY: Moderate   GOALS: Goals reviewed with patient? Yes  SHORT TERM GOALS: Target date: 08/15/2022  Pt will be independent with HEP in order to improve strength and balance in order to decrease fall risk and improve function at home and work.  Baseline:06/27/22 HEP given  Goal status: INITIAL    LONG TERM GOALS: Target date: 09/12/2022  Patient will increase FOTO score to 30  to demonstrate predicted increase in functional mobility to complete ADLs  Baseline: 21 Goal status: INITIAL  2.  Pt  will demonstrate 1RM leg press of at least 162# on LLE in order to demonstrate 90% power/strength of dominant  Baseline: 145# Goal status: INITIAL  3.  Pt will decrease worst pain as reported on NPRS by at least 3 points in order to demonstrate clinically significant reduction in pain.  Baseline: 9/10 Goal status: INITIAL  4.  Pt will demonstrate triple leg hop of LLE of at least 11ft in order to demonstrate 90% power of RLE Baseline: R: 48ft 3in L: 17ft 14in Goal status: INITIAL    PLAN: PT FREQUENCY: 1-2x/week  PT DURATION: 8 weeks  PLANNED INTERVENTIONS: Therapeutic exercises, Therapeutic activity, Neuromuscular re-education, Balance training, Gait training, Patient/Family education, Self Care, Joint mobilization, Joint manipulation, Stair training, DME instructions, Dry Needling, Electrical stimulation, Cryotherapy, Moist heat, Taping, Traction, Ultrasound, Fluidotherapy, Manual therapy, and Re-evaluation.  PLAN FOR NEXT SESSION: HEP review  Durwin Reges DPT  Durwin Reges, PT 07/18/2022, 10:41 AM

## 2022-07-20 ENCOUNTER — Ambulatory Visit: Admitting: Physical Therapy

## 2022-07-24 ENCOUNTER — Ambulatory Visit

## 2022-07-27 ENCOUNTER — Ambulatory Visit

## 2022-07-27 DIAGNOSIS — M545 Low back pain, unspecified: Secondary | ICD-10-CM

## 2022-07-27 DIAGNOSIS — M25551 Pain in right hip: Secondary | ICD-10-CM

## 2022-07-27 DIAGNOSIS — G8929 Other chronic pain: Secondary | ICD-10-CM

## 2022-07-27 NOTE — Therapy (Signed)
OUTPATIENT PHYSICAL THERAPY THORACOLUMBAR TREATMENT   Patient Name: Evan Leach MRN: [id number] DOB:2001-01-02, 21 y.o., male Today's Date: 07/27/2022   PT End of Session - 07/27/22 0836     Visit Number 6    Number of Visits 16    Date for PT Re-Evaluation 08/25/22    Authorization Type [employer] 2023    Progress Note Due on Visit 10    PT Start Time 0832    PT Stop Time 0912    PT Time Calculation (min) 40 min    Activity Tolerance Patient tolerated treatment well    Behavior During Therapy WFL for tasks assessed/performed                Past Medical History:  Diagnosis Date   Anxiety    Depression    GERD (gastroesophageal reflux disease)    Past Surgical History:  Procedure Laterality Date   WISDOM TOOTH EXTRACTION     Patient Active Problem List   Diagnosis Date Noted   Chronic pain of right hip 07/07/2022   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 07/07/2022   Strain of muscle of right groin region 04/07/2020   Acute right-sided low back pain with bilateral sciatica 04/07/2020   Right sided sciatica 04/07/2020   Right groin pain 04/07/2020    PCP: Lynn Ito PA  REFERRING PROVIDER: Lynn Ito PA  REFERRING DIAG: chronic R hip pain  Rationale for Evaluation and Treatment Rehabilitation  THERAPY DIAG:  Chronic bilateral low back pain, unspecified whether sciatica present  Pain in right hip  Low back pain, unspecified back pain laterality, unspecified chronicity, unspecified whether sciatica present  ONSET DATE: 4 years ago  SUBJECTIVE:                                                                                                                                                                                           SUBJECTIVE STATEMENT:  Hip a little more painful, reports with his new CPAP leading to more back sleeping which makes his hip more sore in the morning.   PERTINENT HISTORY:  R hip pain  that began 4 years after hiking with a large pack and running. Has had insidious worsening pain over past 2 years. Reports pain is the R side of low back and R glute; with L sided pain over the past 2 years, not as bad as R. Reports pain is stabbing in the back and glute with burning pain down inside of each leg. Current pain 4/10; lowest 2/10; worst 9/10. Pain is aggravated by stair negotiation, walking on uneven surfaces (on even surfaces as well but  not as much), being in a car for more than 46mins, and with forward bending. 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 Would like to be able to walk and ride in the car without pain. Pt denies N/V, B&B changes, unexplained weight fluctuation, saddle paresthesia, fever, night sweats, or unrelenting night pain at this time.   PAIN:  Are you having pain? Yes: NPRS scale: 5/10 Pain location: L hip anterior Pain description: stabbing pain with occasional radiation down RLE Aggravating factors: stair negotiation, walking on uneven surfaces (on even surfaces as well but not as much), being in a car for more than 74mins, and with forward bending Relieving factors: none- pain medication with minimal help    (Blank rows = not tested)     LUMBAR SPECIAL TESTS:  Straight leg raise test: Negative, Slump test: Negative, FABER test: Positive, and Thomas test: Positive bilat; Cross traight leg raise negative bilat  FUNCTIONAL TESTS:  10 meter walk test: 1.28m/s  GAIT: Distance walked: 20m Assistive device utilized: None Level of assistance: Complete Independence Comments: Compensated trendlenberg; L lateral trunk lean throughout    TODAY'S TREATMENT  Therex  Nustep seat 9 L3 27mins   SUPER SET 1 L SL squat 1x 10 from slightly elevated mat table with good carry over of technique L reverse lunge with R foot on slider 1x 12 with good carry over of initial demo  L step up onto 12in step  with eccentric heel tap to lower 1x 8 with heavy cuing for technique with good carry over  Hooklying Left psoas release x90 sec    SUPER SET 2 L SL squat 1x 10 from slightly elevated mat table with good carry over of technique L reverse lunge with R foot on slider 1x 12 with good carry over of initial demo  L step up onto 12in step with eccentric heel tap to lower 1x 8 with heavy cuing for technique with good carry over     Hooklying Left pectineus release x90 sec, Left psoas release x90 sec   Standing Left hip flexion from 12" step to end range height, 5lb  Sumo RDL 40lb KB to 12" studded hemisphere  Sumo RDL 40lb KB to 12" studded hemisphere   PATIENT EDUCATION:  Education details: therex form/technique Person educated: Patient Education method: Consulting civil engineer, Media planner, Verbal cues, and Handouts Education comprehension: verbalized understanding and returned demonstration   HOME EXERCISE PROGRAM: [id number]  ASSESSMENT:  CLINICAL IMPRESSION: In lieu of pain, PT progressed therex for increased L hip activation/strengthening and R hip mobility with success. Patient is able to demonstrate increased depth of L SL squat and decreased RLE use in LLE step up, evidencing increasing LLE strength. Patient is able to comply with all cuing for proper technique of therex with good effort throughout session. Pt will continue to benefit from skilled PT to return to optimal PLOF.     OBJECTIVE IMPAIRMENTS Abnormal gait, decreased activity tolerance, decreased endurance, decreased mobility, difficulty walking, decreased ROM, decreased strength, increased fascial restrictions, increased muscle spasms, impaired flexibility, impaired tone, improper body mechanics, postural dysfunction, and pain.   ACTIVITY LIMITATIONS lifting, bending, sitting, stairs, and locomotion level  PARTICIPATION LIMITATIONS: driving, shopping, community activity, and occupation  [place], Past/current  experiences, Profession, Time since onset of injury/illness/exacerbation, and 1 comorbidity: chronic pain  are also affecting patient's functional outcome.   REHAB POTENTIAL: Good  CLINICAL DECISION MAKING: Evolving/moderate complexity  EVALUATION COMPLEXITY:  Moderate   GOALS: Goals reviewed with patient? Yes  SHORT TERM GOALS: Target date: 08/24/2022  Pt will be independent with HEP in order to improve strength and balance in order to decrease fall risk and improve function at home and work.  Baseline:06/27/22 HEP given  Goal status: INITIAL    LONG TERM GOALS: Target date: 09/21/2022  Patient will increase FOTO score to 30  to demonstrate predicted increase in functional mobility to complete ADLs  Baseline: 21 Goal status: INITIAL  2.  Pt will demonstrate 1RM leg press of at least 162# on LLE in order to demonstrate 90% power/strength of dominant  Baseline: 145# Goal status: INITIAL  3.  Pt will decrease worst pain as reported on NPRS by at least 3 points in order to demonstrate clinically significant reduction in pain.  Baseline: 9/10 Goal status: INITIAL  4.  Pt will demonstrate triple leg hop of LLE of at least 75ft in order to demonstrate 90% power of RLE Baseline: R: 62ft 3in L: 28ft 14in Goal status: INITIAL    PLAN: PT FREQUENCY: 1-2x/week  PT DURATION: 8 weeks  PLANNED INTERVENTIONS: Therapeutic exercises, Therapeutic activity, Neuromuscular re-education, Balance training, Gait training, Patient/Family education, Self Care, Joint mobilization, Joint manipulation, Stair training, DME instructions, Dry Needling, Electrical stimulation, Cryotherapy, Moist heat, Taping, Traction, Ultrasound, Fluidotherapy, Manual therapy, and Re-evaluation.  PLAN FOR NEXT SESSION: HEP review   9:10 AM, 07/27/22 Rosamaria Lints, PT, DPT Physical Therapist - [place] [phone] (Office)    Pettit C, PT 07/27/2022, 8:39 AM

## 2022-08-01 ENCOUNTER — Ambulatory Visit: Attending: Physician Assistant | Admitting: Physical Therapy

## 2022-08-01 DIAGNOSIS — M545 Low back pain, unspecified: Secondary | ICD-10-CM | POA: Insufficient documentation

## 2022-08-01 DIAGNOSIS — G8929 Other chronic pain: Secondary | ICD-10-CM | POA: Insufficient documentation

## 2022-08-01 DIAGNOSIS — M25551 Pain in right hip: Secondary | ICD-10-CM | POA: Insufficient documentation

## 2022-08-03 ENCOUNTER — Ambulatory Visit: Admitting: Physical Therapy

## 2022-08-03 ENCOUNTER — Encounter: Payer: Self-pay | Admitting: Family Medicine

## 2022-08-03 ENCOUNTER — Encounter: Payer: Self-pay | Admitting: Physician Assistant

## 2022-08-03 NOTE — Telephone Encounter (Signed)
Please advise 

## 2022-08-03 NOTE — Telephone Encounter (Signed)
Printed

## 2022-08-08 ENCOUNTER — Ambulatory Visit: Admitting: Physical Therapy

## 2022-08-08 ENCOUNTER — Encounter: Payer: Self-pay | Admitting: Physical Therapy

## 2022-08-08 DIAGNOSIS — G8929 Other chronic pain: Secondary | ICD-10-CM | POA: Diagnosis present

## 2022-08-08 DIAGNOSIS — M25551 Pain in right hip: Secondary | ICD-10-CM

## 2022-08-08 DIAGNOSIS — M545 Low back pain, unspecified: Secondary | ICD-10-CM | POA: Diagnosis not present

## 2022-08-08 NOTE — Therapy (Signed)
OUTPATIENT PHYSICAL THERAPY THORACOLUMBAR TREATMENT   Patient Name: Evan Leach MRN: [id number] DOB:[birth year], 21 y.o., male Today's Date: 08/09/2022   Past Medical History:  Diagnosis Date   Anxiety    Depression    GERD (gastroesophageal reflux disease)    Past Surgical History:  Procedure Laterality Date   WISDOM TOOTH EXTRACTION     Patient Active Problem List   Diagnosis Date Noted   Chronic pain of right hip 07/07/2022   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 07/07/2022   Strain of muscle of right groin region 04/07/2020   Acute right-sided low back pain with bilateral sciatica 04/07/2020   Right sided sciatica 04/07/2020   Right groin pain 04/07/2020    PCP: Lynn Ito PA  REFERRING PROVIDER: Lynn Ito PA  REFERRING DIAG: chronic R hip pain  Rationale for Evaluation and Treatment Rehabilitation  THERAPY DIAG:  Chronic bilateral low back pain, unspecified whether sciatica present  Pain in right hip  ONSET DATE: 4 years ago  SUBJECTIVE:                                                                                                                                                                                           SUBJECTIVE STATEMENT:  Pt reports his hip has been pretty good overall, but that it has been giving him a little issue over the past 2 days. Reports insidious onset of increased pain. Reports compliance with HEP . Pain is 6/10 today  PERTINENT HISTORY:  R hip pain that began 4 years after hiking with a large pack and running. Has had insidious worsening pain over past 2 years. Reports pain is the R side of low back and R glute; with L sided pain over the past 2 years, not as bad as R. Reports pain is stabbing in the back and glute with burning pain down inside of each leg. Current pain 4/10; lowest 2/10; worst 9/10. Pain is aggravated by stair negotiation, walking on uneven surfaces (on even surfaces as  well but not as much), being in a car for more than , and with forward bending. 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 Would like to be able to walk and ride in the car without pain. Pt denies N/V, B&B changes, unexplained weight fluctuation, saddle paresthesia, fever, night sweats, or unrelenting night pain at this time.   PAIN:  Are you having pain? Yes: NPRS scale: 5/10 Pain location: L hip anterior Pain description: stabbing pain with occasional radiation down RLE Aggravating factors: stair negotiation, walking on uneven surfaces (  on even surfaces as well but not as much), being in a car for more than 11mins, and with forward bending Relieving factors: none- pain medication with minimal help    (Blank rows = not tested)     LUMBAR SPECIAL TESTS:  Straight leg raise test: Negative, Slump test: Negative, FABER test: Positive, and Thomas test: Positive bilat; Cross traight leg raise negative bilat  FUNCTIONAL TESTS:  10 meter walk test: 1.37m/s  GAIT: Distance walked: 42m Assistive device utilized: None Level of assistance: Complete Independence Comments: Compensated trendlenberg; L lateral trunk lean throughout    TODAY'S TREATMENT  Therex  Nustep seat 9 L3 64mins for gentle strengthening  L single leg squat 2x 10 with ease to mat table R single leg squat 2x 10 with no pain, difficulty with balance  Alt lateral lunge 2x 12 with good carry over of technique; stretch at adductors L>R  Alt Lateral jump 2x 10 with cuing for increased distance- minimal pain in L knee  R single leg deadlift 30# KB 2x 10 with good carry over of initial demo for technique  Half kneeling overhead reach 5sec hold x6 Thomas stretch 30sec   PATIENT EDUCATION:  Education details: therex form/technique Person educated: Patient Education method: Consulting civil engineer, Media planner, Verbal cues, and Handouts Education comprehension:  verbalized understanding and returned demonstration   HOME EXERCISE PROGRAM: [id number]  ASSESSMENT:  CLINICAL IMPRESSION: In lieu of pain, PT progressed therex for increased L hip activation/strengthening and R hip mobility with success. PT continued gentle initiation of RLE strengthening with success. Pt is able to demonstrate good effort throughout session without increased pain. Pt complies with all cuing for proper technique of therex. Pt will continue to benefit from skilled PT to return to optimal PLOF.     OBJECTIVE IMPAIRMENTS Abnormal gait, decreased activity tolerance, decreased endurance, decreased mobility, difficulty walking, decreased ROM, decreased strength, increased fascial restrictions, increased muscle spasms, impaired flexibility, impaired tone, improper body mechanics, postural dysfunction, and pain.   ACTIVITY LIMITATIONS lifting, bending, sitting, stairs, and locomotion level  PARTICIPATION LIMITATIONS: driving, shopping, community activity, and occupation  [place], Past/current experiences, Profession, Time since onset of injury/illness/exacerbation, and 1 comorbidity: chronic pain  are also affecting patient's functional outcome.   REHAB POTENTIAL: Good  CLINICAL DECISION MAKING: Evolving/moderate complexity  EVALUATION COMPLEXITY: Moderate   GOALS: Goals reviewed with patient? Yes  SHORT TERM GOALS: Target date: 09/06/2022  Pt will be independent with HEP in order to improve strength and balance in order to decrease fall risk and improve function at home and work.  Baseline:06/27/22 HEP given  Goal status: INITIAL    LONG TERM GOALS: Target date: 10/04/2022  Patient will increase FOTO score to 30  to demonstrate predicted increase in functional mobility to complete ADLs  Baseline: 21 Goal status: INITIAL  2.  Pt will demonstrate 1RM leg press of at least 162# on LLE in order to demonstrate 90% power/strength of dominant  Baseline:  145# Goal status: INITIAL  3.  Pt will decrease worst pain as reported on NPRS by at least 3 points in order to demonstrate clinically significant reduction in pain.  Baseline: 9/10 Goal status: INITIAL  4.  Pt will demonstrate triple leg hop of LLE of at least 78ft in order to demonstrate 90% power of RLE Baseline: R: 60ft 3in L: 68ft 14in Goal status: INITIAL    PLAN: PT FREQUENCY: 1-2x/week  PT DURATION: 8 weeks  PLANNED INTERVENTIONS: Therapeutic exercises, Therapeutic activity, Neuromuscular re-education, Balance training, Gait  training, Patient/Family education, Self Care, Joint mobilization, Joint manipulation, Stair training, DME instructions, Dry Needling, Electrical stimulation, Cryotherapy, Moist heat, Taping, Traction, Ultrasound, Fluidotherapy, Manual therapy, and Re-evaluation.  PLAN FOR NEXT SESSION: HEP review   Hilda Lias DPT  Hilda Lias, PT 08/09/2022, 2:32 PM

## 2022-08-11 ENCOUNTER — Ambulatory Visit: Admitting: Physical Therapy

## 2022-08-11 ENCOUNTER — Encounter: Payer: Self-pay | Admitting: Physical Therapy

## 2022-08-11 DIAGNOSIS — G8929 Other chronic pain: Secondary | ICD-10-CM

## 2022-08-11 DIAGNOSIS — M25551 Pain in right hip: Secondary | ICD-10-CM

## 2022-08-11 DIAGNOSIS — M545 Low back pain, unspecified: Secondary | ICD-10-CM | POA: Diagnosis not present

## 2022-08-11 NOTE — Therapy (Signed)
OUTPATIENT PHYSICAL THERAPY THORACOLUMBAR TREATMENT   Patient Name: Evan Leach MRN: [id number] DOB:09/24/2001, 21 y.o., male Today's Date: 08/11/2022   Past Medical History:  Diagnosis Date   Anxiety    Depression    GERD (gastroesophageal reflux disease)    Past Surgical History:  Procedure Laterality Date   WISDOM TOOTH EXTRACTION     Patient Active Problem List   Diagnosis Date Noted   Chronic pain of right hip 07/07/2022   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 07/07/2022   Strain of muscle of right groin region 04/07/2020   Acute right-sided low back pain with bilateral sciatica 04/07/2020   Right sided sciatica 04/07/2020   Right groin pain 04/07/2020    PCP: Lynn Ito PA  REFERRING PROVIDER: Lynn Ito PA  REFERRING DIAG: chronic R hip pain  Rationale for Evaluation and Treatment Rehabilitation  THERAPY DIAG:  Chronic bilateral low back pain, unspecified whether sciatica present  Pain in right hip  ONSET DATE: 4 years ago  SUBJECTIVE:                                                                                                                                                                                           SUBJECTIVE STATEMENT:  Pt reports no pain this am. Doing very well overall. Reports minimal knee pain since last visit, other than some increased pain with cleaning the house yesterday. 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 Pt initiated conversation of decreasing PT frequency, or possibly discharge to HEP for maintenance which patient is resistant to, reporting he is worried his pain may flare up again. PT educated patient on goal of PT to develop independence to be able to self soothe future flare ups with HEP  PERTINENT HISTORY:  R hip pain that began 4 years after hiking with a large pack and running. Has had insidious worsening pain  over past 2 years. Reports pain is the R side of low back and R glute; with L sided pain over the past 2 years, not as bad as R. Reports pain is stabbing in the back and glute with burning pain down inside of each leg. Current pain 4/10; lowest 2/10; worst 9/10. Pain is aggravated by stair negotiation, walking on uneven surfaces (on even surfaces as well but not as much), being in a car for more than , and with forward bending. 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 Would like to be able to walk and ride in  the car without pain. Pt denies N/V, B&B changes, unexplained weight fluctuation, saddle paresthesia, fever, night sweats, or unrelenting night pain at this time.   PAIN:  Are you having pain? Yes: NPRS scale: 5/10 Pain location: L hip anterior Pain description: stabbing pain with occasional radiation down RLE Aggravating factors: stair negotiation, walking on uneven surfaces (on even surfaces as well but not as much), being in a car for more than , and with forward bending Relieving factors: none- pain medication with minimal help    (Blank rows = not tested)     LUMBAR SPECIAL TESTS:  Straight leg raise test: Negative, Slump test: Negative, FABER test: Positive, and Thomas test: Positive bilat; Cross traight leg raise negative bilat  FUNCTIONAL TESTS:  10 meter walk test: 1.51m/s  GAIT: Distance walked: 63m Assistive device utilized: None Level of assistance: Complete Independence Comments: Compensated trendlenberg; L lateral trunk lean throughout    TODAY'S TREATMENT  Therex  Nustep seat 9 L3 for gentle strengthening  L single leg squat 2x 10 with 5# DB BUE flex with ease to mat table R single leg squat 2x 10 DB BUE flex, difficulty with balance- pt reports some pain with therex for first session in a few sessions    L runners lunge 2x 10/8 with pt able to participate to max depth- prior to  therex patient reports fearful of this movement  R runners lunge 2x 10 with pain 4/10 during exercises, ceases following exercise  Alt lateral lunge 2x 12 with good carry over of technique;   Half kneeling overhead reach 5sec hold x6 Half kneeling overhead reach 30sec   PATIENT EDUCATION:  Education details: therex form/technique Person educated: Patient Education method: Programmer, multimedia, Demonstration, Verbal cues, and Handouts Education comprehension: verbalized understanding and returned demonstration   HOME EXERCISE PROGRAM: [id number]  ASSESSMENT:  CLINICAL IMPRESSION: AT initiation of session patient reports doing well, minimal pain cine last visit, prompting inquisition of possible near discharge to HEP for pain management and strength maintenance. Patient hesitant to decrease PT frequency or d/c due to fear of a future "flare up" of pain. Patient admits hesitancy to familiar therex this session following as well. Patient is able to complete all therex with proper technique following cuing with good effort throughout session. Patient with some increase in R hip pain with therex that subsides at cease of therex. Pt will continue to benefit from skilled PT to return to optimal PLOF.     OBJECTIVE IMPAIRMENTS Abnormal gait, decreased activity tolerance, decreased endurance, decreased mobility, difficulty walking, decreased ROM, decreased strength, increased fascial restrictions, increased muscle spasms, impaired flexibility, impaired tone, improper body mechanics, postural dysfunction, and pain.   ACTIVITY LIMITATIONS lifting, bending, sitting, stairs, and locomotion level  PARTICIPATION LIMITATIONS: driving, shopping, community activity, and occupation  PERSONAL FACTORS Fitness, Past/current experiences, Profession, Time since onset of injury/illness/exacerbation, and 1 comorbidity: chronic pain  are also affecting patient's functional outcome.   REHAB POTENTIAL: Good  CLINICAL  DECISION MAKING: Evolving/moderate complexity  EVALUATION COMPLEXITY: Moderate   GOALS: Goals reviewed with patient? Yes  SHORT TERM GOALS: Target date: 09/08/2022  Pt will be independent with HEP in order to improve strength and balance in order to decrease fall risk and improve function at home and work.  Baseline:06/27/22 HEP given  Goal status: INITIAL    LONG TERM GOALS: Target date: 10/06/2022  Patient will increase FOTO score to 50  to demonstrate predicted increase in functional mobility to complete ADLs  Baseline: 21; 08/11/22  57 Goal status: INITIAL  2.  Pt will demonstrate 1RM leg press of at least 162# on LLE in order to demonstrate 90% power/strength of dominant  Baseline: 145# Goal status: INITIAL  3.  Pt will decrease worst pain as reported on NPRS by at least 3 points in order to demonstrate clinically significant reduction in pain.  Baseline: 9/10INITIAL  4.  Pt will demonstrate triple leg hop of LLE of at least 88ft in order to demonstrate 90% power of RLE Baseline: R: 38ft 3in L: 32ft 14in Goal status: INITIAL    PLAN: PT FREQUENCY: 1-2x/week  PT DURATION: 8 weeks  PLANNED INTERVENTIONS: Therapeutic exercises, Therapeutic activity, Neuromuscular re-education, Balance training, Gait training, Patient/Family education, Self Care, Joint mobilization, Joint manipulation, Stair training, DME instructions, Dry Needling, Electrical stimulation, Cryotherapy, Moist heat, Taping, Traction, Ultrasound, Fluidotherapy, Manual therapy, and Re-evaluation.  PLAN FOR NEXT SESSION: HEP review   Durwin Reges DPT  Durwin Reges, PT 08/11/2022, 10:01 AM

## 2022-08-14 ENCOUNTER — Ambulatory Visit: Admitting: Physical Therapy

## 2022-08-15 NOTE — Telephone Encounter (Signed)
FYI-Printed new form

## 2022-08-16 ENCOUNTER — Ambulatory Visit: Admitting: Physical Therapy

## 2022-08-18 ENCOUNTER — Encounter: Payer: Self-pay | Admitting: Family Medicine

## 2022-08-18 ENCOUNTER — Ambulatory Visit (INDEPENDENT_AMBULATORY_CARE_PROVIDER_SITE_OTHER): Payer: Self-pay | Admitting: Family Medicine

## 2022-08-18 VITALS — BP 124/78 | HR 50 | Ht 68.0 in | Wt 214.0 lb

## 2022-08-18 DIAGNOSIS — M5117 Intervertebral disc disorders with radiculopathy, lumbosacral region: Secondary | ICD-10-CM

## 2022-08-18 DIAGNOSIS — G8929 Other chronic pain: Secondary | ICD-10-CM

## 2022-08-18 DIAGNOSIS — M25551 Pain in right hip: Secondary | ICD-10-CM

## 2022-08-18 MED ORDER — CELECOXIB 100 MG PO CAPS: 100.0000 mg | ORAL_CAPSULE | Freq: Two times a day (BID) | ORAL | 1 refills | Status: AC | PRN

## 2022-08-18 NOTE — Telephone Encounter (Signed)
Completed today 08/18/2022 at office visit.

## 2022-08-18 NOTE — Assessment & Plan Note (Signed)
Chronic issue with ongoing symptomatology, patient has not been dosing medications consistently, we have not had MR arthrogram performed yet, patient waiting on [place] for coverage and next steps.  Again noted positive FADIR and symptoms localizing to the right hip joint, in addition to medication management as discussed in note, can discuss intra-articular corticosteroid injection.  Patient will contact us to schedule this.

## 2022-08-18 NOTE — Assessment & Plan Note (Signed)
Ongoing symptomatology for this chronic issue, this is with sporadic dosing of gabapentin, cyclobenzaprine, and diclofenac.  Still demonstrating positive radicular features with modified straight leg raise noted on exam.  At this stage I will place a referral to pain and spine group for additional interventional management options, transition diclofenac to Celebrex for better GI tolerance, encouraged consistent usage of gabapentin, continued as needed usage of cyclobenzaprine.

## 2022-08-18 NOTE — Progress Notes (Signed)
     Primary Care / Sports Medicine Office Visit  Patient Information:  Patient ID: [name], male DOB: 02-23-2001 Age: 21 y.o. MRN: [id number]   Evan Leach is a pleasant 21 y.o. male presenting with the following:  Chief Complaint  Patient presents with   Chronic pain of right hip    States hip isn't any better, M24.851    Vitals:   08/18/22 0822  BP: 124/78  Pulse: (!) 50  SpO2: 97%   Vitals:   08/18/22 0822  Weight: 214 lb (97.1 kg)  Height: 5\' 8"  (1.727 m)   Body mass index is 32.54 kg/m.  No results found.   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  Still demonstrating positive radicular features with modified straight leg raise noted on exam.  At this stage I will place a referral to pain and spine group for additional interventional management options, transition diclofenac to Celebrex for better GI tolerance, encouraged consistent usage of gabapentin, continued as needed usage of cyclobenzaprine.      Relevant Medications   celecoxib (CELEBREX) 100 MG capsule   Other Relevant Orders   Ambulatory referral to Anesthesiology     Other   Chronic pain of right hip    Chronic issue with ongoing symptomatology, patient has not been dosing medications consistently, we have not had MR arthrogram performed yet, patient waiting on [place] for coverage and next steps.  Again noted positive FADIR and symptoms localizing to the right hip joint, in addition to medication management as discussed in note, can discuss intra-articular corticosteroid injection.  Patient will  contact us to schedule this.      Relevant Medications   celecoxib (CELEBREX) 100 MG capsule     Orders & Medications Meds ordered this encounter  Medications   celecoxib (CELEBREX) 100 MG capsule    Sig: Take 1 capsule (100 mg total) by mouth 2 (two) times daily as needed.    Dispense:  60 capsule    Refill:  1   Orders Placed This Encounter  Procedures   Ambulatory referral to Anesthesiology     No follow-ups on file.     [name], MD   Primary Care Sports Medicine [place]

## 2022-08-18 NOTE — Patient Instructions (Addendum)
-   Start Celebrex once daily with food, can advance to twice daily with food if tolerating from a GI standpoint (no other NSAIDs while on this medication) - Dose gabapentin nightly for nerve related pain - Can dose cyclobenzaprine nightly as needed for muscle tightness pain - Referral coordinator will contact you to schedule visit with pain and spine group - Contact our office for right hip cortisone injection

## 2022-08-21 ENCOUNTER — Encounter: Admitting: Physical Therapy

## 2022-08-24 ENCOUNTER — Telehealth: Payer: Self-pay | Admitting: Family Medicine

## 2022-08-24 ENCOUNTER — Other Ambulatory Visit: Payer: Self-pay

## 2022-08-24 DIAGNOSIS — M5117 Intervertebral disc disorders with radiculopathy, lumbosacral region: Secondary | ICD-10-CM

## 2022-08-24 DIAGNOSIS — G8929 Other chronic pain: Secondary | ICD-10-CM

## 2022-08-24 NOTE — Telephone Encounter (Signed)
Pt is calling to report that he was discharged [place]. Pt reports that he would like to [facility] states that they would just need a new referral.  CB- [phone]

## 2022-08-24 NOTE — Telephone Encounter (Signed)
Please advise 

## 2022-08-24 NOTE — Telephone Encounter (Signed)
Referral placed, pt updated

## 2022-08-28 ENCOUNTER — Encounter: Admitting: Physical Therapy

## 2022-09-01 ENCOUNTER — Encounter: Admitting: Physical Therapy

## 2022-09-12 ENCOUNTER — Encounter: Admitting: Physical Therapy

## 2022-09-14 ENCOUNTER — Other Ambulatory Visit: Payer: Self-pay

## 2022-09-14 ENCOUNTER — Encounter: Admitting: Physical Therapy

## 2022-09-14 ENCOUNTER — Ambulatory Visit: Attending: Physician Assistant | Admitting: Physical Therapy

## 2022-09-14 DIAGNOSIS — M5117 Intervertebral disc disorders with radiculopathy, lumbosacral region: Secondary | ICD-10-CM | POA: Insufficient documentation

## 2022-09-14 DIAGNOSIS — M25551 Pain in right hip: Secondary | ICD-10-CM | POA: Insufficient documentation

## 2022-09-14 DIAGNOSIS — G8929 Other chronic pain: Secondary | ICD-10-CM | POA: Diagnosis not present

## 2022-09-14 DIAGNOSIS — M5459 Other low back pain: Secondary | ICD-10-CM | POA: Insufficient documentation

## 2022-09-14 NOTE — Therapy (Signed)
OUTPATIENT PHYSICAL THERAPY THORACOLUMBAR EVALUATION   Patient Name: Evan Leach MRN: [id number] DOB:[birth year], 21 y.o., male Today's Date: 09/14/2022  END OF SESSION:  PT End of Session - 09/14/22 1246     Visit Number 1    Number of Visits 20    Date for PT Re-Evaluation 11/23/22    Authorization Type [employer] 2023    Authorization Time Period 09/14/22-11/23/22    Authorization - Visit Number 1    Authorization - Number of Visits 20    Progress Note Due on Visit 10    PT Start Time 1100    PT Stop Time 1145    PT Time Calculation (min) 45 min    Activity Tolerance Patient limited by pain    Behavior During Therapy [facility] for tasks assessed/performed             Past Medical History:  Diagnosis Date   Anxiety    Depression    GERD (gastroesophageal reflux disease)    Past Surgical History:  Procedure Laterality Date   WISDOM TOOTH EXTRACTION     Patient Active Problem List   Diagnosis Date Noted   Chronic pain of right hip 07/07/2022   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 Joseph Berkshire   REFERRING PROVIDER: Dr. 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    PAIN:  Are you having pain? Yes: NPRS scale: 4/10 Pain location: Right groin and central spinous processes of lumbar spine  Pain description: Sharp  Aggravating factors: Flexing hip, squatting, bending over hip  Relieving factors: Laying on the ground and joint cavitation   PRECAUTIONS: None  WEIGHT BEARING RESTRICTIONS: No  FALLS:  Has patient fallen in last 6 months? No  LIVING ENVIRONMENT: Lives with: lives with an adult companion Lives in: House/apartment Stairs: Yes: Internal: 13  steps; on left going up Has following equipment at home: None  OCCUPATION: Disabled   PLOF: Independent  PATIENT GOALS: Less pain when performing ADLs   NEXT MD VISIT:   OBJECTIVE:   VITALS: BP 125/79 HR 69 SpO2 98%  DIAGNOSTIC FINDINGS:  CLINICAL DATA:  Right groin pain   EXAM: DG HIP (WITH OR WITHOUT PELVIS) 2-3V RIGHT   COMPARISON:  None.   FINDINGS: There is no evidence of hip fracture or dislocation. There is no evidence of arthropathy or other focal bone abnormality.   IMPRESSION: No fracture or dislocation of the right hip. Joint spaces are preserved.     Electronically Signed   By: Lauralyn Primes M.D.   On: 04/08/2020 12:37  //////////////////////////////////////////////  CLINICAL DATA:  Persistent back pain.   EXAM: LUMBAR SPINE - COMPLETE 4+ VIEW   COMPARISON:  MRI 10/19/2021   FINDINGS: Five lumbar type vertebral bodies show normal alignment. No evidence of disc space narrowing by radiography. No facet arthropathy or pars defect. No focal bone lesion.   IMPRESSION: Normal lumbar radiographs.     Electronically Signed   By: Paulina Fusi M.D.   On: 12/09/2021 23:15  PATIENT SURVEYS:  FOTO 21/100 with target 51  SCREENING FOR RED FLAGS: Bowel or bladder incontinence: No Spinal tumors: No Cauda equina syndrome: No Compression fracture: No Abdominal aneurysm: No  COGNITION: Overall cognitive status: Within functional  limits for tasks assessed     SENSATION: Light touch: Impaired  and Bilateral numbness and tingling down both LE; RLE>LLE   MUSCLE LENGTH: Hamstrings: Right 60 deg; Left 60 deg Thomas test: Negative bilateral   POSTURE: No Significant postural limitations  PALPATION: L4-L5 Central spinous processes   LUMBAR ROM:   AROM eval  Flexion 90%*  Extension 100%  Right lateral flexion 100%  Left lateral flexion 100%*  Right rotation 100%  Left rotation 100%   (Blank rows = not tested)  LOWER EXTREMITY ROM:       Active  Right 09/15/2023 Left 09/15/2023  Hip flexion 120 120  Hip extension 30 30  Hip abduction 45 45  Hip adduction 30 30  Hip internal rotation 45 45  Hip external rotation 45 45  Knee flexion 135 135  Knee extension 0 0  Ankle dorsiflexion 20 20  Ankle plantarflexion 50 50  Ankle inversion    Ankle eversion     (Blank rows = not tested)     LOWER EXTREMITY MMT:    MMT Right eval Left eval  Hip flexion 4+ 5  Hip extension 4+ 4+  Hip abduction 4 4  Hip adduction 4- 4-   Hip internal rotation    Hip external rotation    Knee flexion 5 5  Knee extension 5 5  Ankle dorsiflexion 5 5  Ankle plantarflexion    Ankle inversion    Ankle eversion     (Blank rows = not tested)  LUMBAR SPECIAL TESTS:  Straight leg raise test: Negative, FABER test: Negative, and Trendelenburg sign: Positive on RLE   FUNCTIONAL TESTS:  Single Leg Stance: Right trendelenburg noted   GAIT: Distance walked: 50 ft  Assistive device utilized: None Level of assistance: Complete Independence Comments: No gait deficits noted   TODAY'S TREATMENT:  DATE:   09/14/22   Seated HS Stretch 3 x 30 sec  Standing Hip Abduction 2 x 10     PATIENT EDUCATION:  Education details: form and technique for appropriate exercise. Explanation about differentials    Person educated: Patient Education method: Explanation, Demonstration, Verbal cues, and Handouts Education comprehension: verbalized understanding, returned demonstration, and verbal cues required  HOME EXERCISE PROGRAM: Access Code: [email or web address] URL: [email or web address] Date: 09/14/2022 Prepared by: Ellin Goodie  Exercises - Seated Table Hamstring Stretch  - 1 x daily - 3 reps - 30 sec hold - Standing Hip Abduction  - 3 x weekly - 3 sets - 10 reps  ASSESSMENT:  CLINICAL IMPRESSION: Patient is a 21 y.o. male who was seen today for physical therapy evaluation and treatment for right hip and low back pain. 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 Low back pathology is unclear given no imaging noted for tissue damage and radicular pain ruled out with straight leg raise. He shows deficits that include decreased right hip strength along with pain. PT will address these aforementioned deficits to improve pt's right hip strength and to decrease hip pain to resume everyday activities without excessive pain. Frequency adjusted to only scheduling 1 appointment at a time, because of prior appointment no shows.   OBJECTIVE IMPAIRMENTS: decreased strength, impaired sensation, and pain.   ACTIVITY LIMITATIONS: carrying, lifting, bending, standing, squatting, and stairs  PARTICIPATION LIMITATIONS: meal prep, cleaning, laundry, community activity, and yard work  PERSONAL FACTORS: Past/current experiences, Time since onset of injury/illness/exacerbation, and 1-2 comorbidities: chronic low back pain and multiple psychological risk factors  are also affecting patient's functional outcome.   REHAB POTENTIAL: Fair prior bout of PT unsuccessful and chronicity of issue   CLINICAL DECISION MAKING:  Stable/uncomplicated  EVALUATION COMPLEXITY: Low   GOALS: Goals reviewed with patient? No  SHORT TERM GOALS: Target date: 09/28/22   Pt will be independent with HEP in order to improve strength and balance in order to decrease fall risk and improve function at home and work. Baseline: NT  Goal status: INITIAL  LONG TERM GOALS: Target date: 11/23/22  Patient will have improved function and activity level as evidenced by an increase in FOTO score by 10 points or more.  Baseline:  Goal status: INITIAL  2.  Patient will improve bilateral hip strength by (1/3 grade MMT) for improved hip stability in order to decrease FAI symptoms and return to performing ADLs without excessive pain.  Baseline: Hip R/L 4+/5, Hip Ext R/L 4+/4+, Hip Add R/L 4-/4-, Hip Abd R/L 44  Goal status: INITIAL  3.  Patient will show near symmetrical hip strength as evidenced by R hip 1 RM being within 10% of L hip RM for leg extension for improved LE function.  Baseline: NT  Goal status: INITIAL   PLAN:  PT FREQUENCY: 1-2x/week  PT DURATION: 10 weeks  PLANNED INTERVENTIONS: Therapeutic exercises, Gait training, Joint mobilization, Joint manipulation, Dry Needling, Electrical stimulation, Spinal manipulation, Spinal mobilization, Cryotherapy, Moist heat, Manual therapy, and Re-evaluation.  PLAN FOR NEXT SESSION: 1 RM testing for leg press, progress hip strengthening, Scour test   Ellin Goodie PT, DPT  09/14/2022, 1:22 PM

## 2022-09-18 ENCOUNTER — Ambulatory Visit: Admitting: Physical Therapy

## 2022-09-18 ENCOUNTER — Telehealth: Payer: Self-pay | Admitting: Physical Therapy

## 2022-09-18 NOTE — Telephone Encounter (Signed)
Called pt to inquire about absence from apt. Pt said he forgot about apt. PT confirmed next apt with pt who said he would be at next apt.

## 2022-09-20 ENCOUNTER — Encounter: Admitting: Physical Therapy

## 2022-09-20 ENCOUNTER — Telehealth: Payer: Self-pay | Admitting: Physical Therapy

## 2022-09-20 ENCOUNTER — Ambulatory Visit: Admitting: Physical Therapy

## 2022-09-20 NOTE — Telephone Encounter (Signed)
Pt has repeatedly missed apts without contacting office even with remainder from coordinating PT. At this point, pt will need to call to schedule and PT will no longer call pt to remind pt per policy.

## 2022-09-25 ENCOUNTER — Encounter: Admitting: Physical Therapy

## 2022-09-27 ENCOUNTER — Encounter: Admitting: Physical Therapy

## 2022-10-02 ENCOUNTER — Encounter: Admitting: Physical Therapy

## 2022-10-04 ENCOUNTER — Encounter: Admitting: Physical Therapy

## 2022-10-05 ENCOUNTER — Encounter: Admitting: Physical Therapy

## 2022-10-09 ENCOUNTER — Encounter: Admitting: Physical Therapy

## 2022-10-11 ENCOUNTER — Encounter: Admitting: Physical Therapy

## 2022-10-12 ENCOUNTER — Encounter: Admitting: Physical Therapy

## 2022-10-16 ENCOUNTER — Encounter: Admitting: Physical Therapy

## 2022-10-19 ENCOUNTER — Encounter: Admitting: Physical Therapy

## 2022-10-24 ENCOUNTER — Encounter: Admitting: Physical Therapy

## 2022-10-26 ENCOUNTER — Encounter: Admitting: Physical Therapy

## 2022-11-02 ENCOUNTER — Encounter: Admitting: Physical Therapy

## 2023-01-15 ENCOUNTER — Ambulatory Visit (INDEPENDENT_AMBULATORY_CARE_PROVIDER_SITE_OTHER): Admitting: Physician Assistant

## 2023-01-15 ENCOUNTER — Encounter: Payer: Self-pay | Admitting: Physician Assistant

## 2023-01-15 VITALS — BP 130/78 | HR 72 | Temp 98.3°F | Resp 16 | Ht 68.0 in | Wt 219.6 lb

## 2023-01-15 DIAGNOSIS — G4733 Obstructive sleep apnea (adult) (pediatric): Secondary | ICD-10-CM | POA: Diagnosis not present

## 2023-01-15 DIAGNOSIS — R300 Dysuria: Secondary | ICD-10-CM

## 2023-01-15 DIAGNOSIS — E783 Hyperchylomicronemia: Secondary | ICD-10-CM | POA: Diagnosis not present

## 2023-01-15 DIAGNOSIS — E349 Endocrine disorder, unspecified: Secondary | ICD-10-CM

## 2023-01-15 DIAGNOSIS — F331 Major depressive disorder, recurrent, moderate: Secondary | ICD-10-CM

## 2023-01-15 DIAGNOSIS — F411 Generalized anxiety disorder: Secondary | ICD-10-CM | POA: Diagnosis not present

## 2023-01-15 DIAGNOSIS — Z0001 Encounter for general adult medical examination with abnormal findings: Secondary | ICD-10-CM | POA: Diagnosis not present

## 2023-01-15 DIAGNOSIS — R946 Abnormal results of thyroid function studies: Secondary | ICD-10-CM

## 2023-01-15 DIAGNOSIS — R5383 Other fatigue: Secondary | ICD-10-CM

## 2023-01-15 NOTE — Progress Notes (Signed)
[facility] [place], [place] [address]  Internal MEDICINE  Office Visit Note  Patient Name: Evan Leach  [id number]  [id number]  Date of Service: 01/15/2023  Chief Complaint  Patient presents with   Annual Exam    Patient would like to check testosterone levels.   Depression   Gastroesophageal Reflux     HPI Pt is here for routine health maintenance examination -he states he won his case with the government and will be retiring medically now, 100% disability with the [facility] now -Following with ortho now -Wearing cpap nightly and states it is helping and he is benefiting from use -Following with the [facility] for psychiatry and doing well with this. States he still takes wellbutrin, hydroxyzine, and 150mg  effexor once daily now -he does still have fatigue, reports some blood work done at [place] for CMP and CBC and was ok. He is interested in checking testosterone levels and will check thyroid and lipids as well.  -He mentions he is interested in going into healthcare and may start with nursing and decide whether to pursue PA vs NP  Current Medication: Outpatient Encounter Medications as of 01/15/2023  Medication Sig   buPROPion (WELLBUTRIN XL) 150 MG 24 hr tablet Take 1 tablet (150 mg total) by mouth daily.   celecoxib (CELEBREX) 100 MG capsule Take 1 capsule (100 mg total) by mouth 2 (two) times daily as needed.   cetirizine (ZYRTEC) 10 MG tablet Take 1 tablet (10 mg total) by mouth daily.   cyclobenzaprine (FLEXERIL) 5 MG tablet Take 1 tablet (5 mg total) by mouth at bedtime.   fluticasone (FLONASE) 50 MCG/ACT nasal spray Place 2 sprays into both nostrils in the morning and at bedtime.   gabapentin (NEURONTIN) 300 MG capsule Take 1 capsule (300 mg total) by mouth at bedtime.   hydrOXYzine (ATARAX/VISTARIL) 25 MG tablet Take by mouth.   indomethacin (INDOCIN) 25 MG capsule Take by mouth.   omeprazole (PRILOSEC) 40 MG capsule Take 1 capsule (40 mg total) by mouth  daily.   ondansetron (ZOFRAN-ODT) 4 MG disintegrating tablet Take 1 tablet (4 mg total) by mouth every 6 (six) hours as needed for nausea or vomiting.   venlafaxine XR (EFFEXOR-XR) 150 MG 24 hr capsule Take 150 mg by mouth daily with breakfast.   [DISCONTINUED] venlafaxine (EFFEXOR) 75 MG tablet Take 1 tablet (75 mg total) by mouth 2 (two) times daily.   No facility-administered encounter medications on file as of 01/15/2023.    Surgical History: Past Surgical History:  Procedure Laterality Date   WISDOM TOOTH EXTRACTION      Medical History: Past Medical History:  Diagnosis Date   Anxiety    Depression    GERD (gastroesophageal reflux disease)     Family History: Family History  Problem Relation Age of Onset   Diabetes Maternal Grandmother    Diabetes Maternal Grandfather    Diabetes Paternal Grandmother       Review of Systems  Constitutional:  Positive for fatigue. Negative for chills and unexpected weight change.  HENT:  Negative for congestion, postnasal drip, rhinorrhea, sneezing and sore throat.   Eyes:  Negative for redness.  Respiratory:  Negative for cough, chest tightness and shortness of breath.   Cardiovascular:  Negative for chest pain and palpitations.  Gastrointestinal:  Negative for abdominal pain, constipation, diarrhea, nausea and vomiting.  Genitourinary:  Negative for dysuria.  Musculoskeletal:  Positive for arthralgias and back pain. Negative for joint swelling and neck pain.  Skin:  Negative for rash.  Neurological:  Positive for numbness. Negative for tremors.  Hematological:  Negative for adenopathy. Does not bruise/bleed easily.  Psychiatric/Behavioral:  Positive for behavioral problems (Depression) and sleep disturbance. Negative for suicidal ideas. The patient is nervous/anxious.      Vital Signs: BP 130/78 Comment: 141/83  Pulse 72   Temp 98.3 F (36.8 C)   Resp 16   Ht 5\' 8"  (1.727 m)   Wt 219 lb 9.6 oz (99.6 kg)   SpO2 96%   BMI  33.39 kg/m    Physical Exam Vitals and nursing note reviewed.  Constitutional:      General: He is not in acute distress.    Appearance: He is well-developed. He is not diaphoretic.  HENT:     Head: Normocephalic and atraumatic.     Mouth/Throat:     Pharynx: No oropharyngeal exudate.  Eyes:     Pupils: Pupils are equal, round, and reactive to light.  Neck:     Thyroid: No thyromegaly.     Vascular: No JVD.     Trachea: No tracheal deviation.  Cardiovascular:     Rate and Rhythm: Normal rate and regular rhythm.     Heart sounds: Normal heart sounds. No murmur heard.    No friction rub. No gallop.  Pulmonary:     Effort: Pulmonary effort is normal. No respiratory distress.     Breath sounds: No wheezing or rales.  Chest:     Chest wall: No tenderness.  Abdominal:     General: Bowel sounds are normal. There is no distension.     Palpations: Abdomen is soft.  Musculoskeletal:     Cervical back: Normal range of motion and neck supple.  Lymphadenopathy:     Cervical: No cervical adenopathy.  Skin:    General: Skin is warm and dry.  Neurological:     Mental Status: He is alert and oriented to person, place, and time.     Cranial Nerves: No cranial nerve deficit.  Psychiatric:        Behavior: Behavior normal.        Thought Content: Thought content normal.        Judgment: Judgment normal.      LABS: No results found for this or any previous visit (from the past 2160 hour(s)).      Assessment/Plan: 1. Encounter for general adult medical examination with abnormal findings CPE performed, will order labs not already done by the [facility]  2. OSA (obstructive sleep apnea) Continue cpap nightly  3. Moderate episode of recurrent major depressive disorder ([place]) Followed by psych at the [facility]  4. GAD (generalized anxiety disorder) Followed by psych at the [facility]  5. Mixed hyperglyceridemia - Lipid Panel With LDL/HDL Ratio  6. Abnormal thyroid exam - TSH + free T4  7.  Testosterone deficiency - Testosterone,Free and Total  8. Other fatigue - Testosterone,Free and Total - Prolactin - TSH + free T4  9. Dysuria - UA/M w/rflx Culture, Routine   General Counseling: iram seminario understanding of the findings of todays visit and agrees with plan of treatment. I have discussed any further diagnostic evaluation that may be needed or ordered today. We also reviewed his medications today. he has been encouraged to call the office with any questions or concerns that should arise related to todays visit.    Counseling:    Orders Placed This Encounter  Procedures   UA/M w/rflx Culture, Routine   Testosterone,Free and Total   Prolactin  TSH + free T4   Lipid Panel With LDL/HDL Ratio    No orders of the defined types were placed in this encounter.   This patient was seen by Drema Dallas, PA-C in collaboration with Dr. Clayborn Bigness as a part of collaborative care agreement.  Total time spent:35 Minutes  Time spent includes review of chart, medications, test results, and follow up plan with the patient.     Lavera Guise, MD  Internal Medicine

## 2023-01-16 LAB — UA/M W/RFLX CULTURE, ROUTINE
Bilirubin, UA: NEGATIVE
Glucose, UA: NEGATIVE
Ketones, UA: NEGATIVE
Leukocytes,UA: NEGATIVE
Nitrite, UA: NEGATIVE
RBC, UA: NEGATIVE
Specific Gravity, UA: 1.021 (ref 1.005–1.030)
Urobilinogen, Ur: 1 mg/dL (ref 0.2–1.0)
pH, UA: 8.5 — ABNORMAL HIGH (ref 5.0–7.5)

## 2023-01-16 LAB — MICROSCOPIC EXAMINATION
Bacteria, UA: NONE SEEN
Casts: NONE SEEN /lpf
Epithelial Cells (non renal): NONE SEEN /hpf (ref 0–10)
RBC, Urine: NONE SEEN /hpf (ref 0–2)
WBC, UA: NONE SEEN /hpf (ref 0–5)

## 2023-01-19 LAB — TESTOSTERONE,FREE AND TOTAL
Testosterone, Free: 11.4 pg/mL (ref 9.3–26.5)
Testosterone: 376 ng/dL (ref 264–916)

## 2023-01-19 LAB — PROLACTIN: Prolactin: 13 ng/mL (ref 3.6–31.5)

## 2023-01-19 LAB — LIPID PANEL WITH LDL/HDL RATIO
Cholesterol, Total: 136 mg/dL (ref 100–199)
HDL: 22 mg/dL — ABNORMAL LOW (ref >39)
LDL Chol Calc (NIH): 75 mg/dL (ref 0–99)
LDL/HDL Ratio: 3.4 ratio (ref 0.0–3.6)
Triglycerides: 234 mg/dL — ABNORMAL HIGH (ref 0–149)
VLDL Cholesterol Cal: 39 mg/dL (ref 5–40)

## 2023-01-19 LAB — TSH+FREE T4
Free T4: 1.23 ng/dL (ref 0.82–1.77)
TSH: 1.31 u[IU]/mL (ref 0.450–4.500)

## 2023-01-23 ENCOUNTER — Telehealth: Payer: Self-pay

## 2023-01-23 NOTE — Telephone Encounter (Signed)
-----   Message from Mylinda Latina, PA-C sent at 01/23/2023  1:42 PM EDT ----- Please let him know that his labs look good. His triglycerides are up and should work on diet and exercise as able.

## 2023-01-23 NOTE — Telephone Encounter (Signed)
Spoke with patient regarding lab results. 

## 2023-04-23 ENCOUNTER — Ambulatory Visit
Admission: EM | Admit: 2023-04-23 | Discharge: 2023-04-23 | Disposition: A | Discharge status: Home / Self Care | Payer: No Typology Code available for payment source | Attending: Emergency Medicine | Admitting: Emergency Medicine

## 2023-04-23 ENCOUNTER — Ambulatory Visit
Admission: RE | Admit: 2023-04-23 | Discharge: 2023-04-23 | Disposition: A | Discharge status: Home / Self Care | Payer: No Typology Code available for payment source | Source: Ambulatory Visit | Attending: Emergency Medicine | Admitting: Emergency Medicine

## 2023-04-23 DIAGNOSIS — R369 Urethral discharge, unspecified: Secondary | ICD-10-CM | POA: Diagnosis not present

## 2023-04-23 DIAGNOSIS — R300 Dysuria: Secondary | ICD-10-CM | POA: Diagnosis present

## 2023-04-23 DIAGNOSIS — N50811 Right testicular pain: Secondary | ICD-10-CM | POA: Insufficient documentation

## 2023-04-23 LAB — POCT URINALYSIS DIP (MANUAL ENTRY)
Bilirubin, UA: NEGATIVE
Glucose, UA: NEGATIVE mg/dL
Ketones, POC UA: NEGATIVE mg/dL
Nitrite, UA: NEGATIVE
Protein Ur, POC: NEGATIVE mg/dL
Spec Grav, UA: 1.01 (ref 1.010–1.025)
Urobilinogen, UA: 0.2 E.U./dL
pH, UA: 6 (ref 5.0–8.0)

## 2023-04-23 MED ORDER — SULFAMETHOXAZOLE-TRIMETHOPRIM 800-160 MG PO TABS: 1.0000 | ORAL_TABLET | Freq: Two times a day (BID) | ORAL | 0 refills | Status: AC

## 2023-04-23 NOTE — ED Notes (Signed)
Central scheduling contacted- advised by representative that patient will be worked into [place] schedule today and to head immediately to that facility. Facility will hold patient until order has resulted.

## 2023-04-23 NOTE — ED Triage Notes (Addendum)
Patient to Urgent Care with complaints of dysuria/ penile discharge/ cloudy urine. Symptoms started three days ago. Reports last night he had an episode where his right testicle was painful- reports this has resolved but returns with activity. No known fevers.   Seen at the [facility] 6/9 for the same symptoms. Negative for STDs. Took course of doxycycline as prescribed but did vomit after one dose of medication.

## 2023-04-23 NOTE — Discharge Instructions (Addendum)
Go to the [facility] for the ultrasound of your scrotum.  Take the Bactrim as directed.  The urine culture and cytology are pending.    Follow up with a urologist such as the one listed below.  Go to the emergency department if you have persistent or worsening symptoms.

## 2023-04-23 NOTE — ED Provider Notes (Signed)
Evan Leach    CSN: [id number] Arrival date & time: 04/23/23  1524      History   Chief Complaint Chief Complaint  Patient presents with   Urinary Frequency    HPI Evan Leach is a 22 y.o. male.  Presents with dysuria, penile discharge, right testicular pain x 1 day.  He had similar symptoms a couple of weeks ago.  He reports he was seen at the [place] on 04/08/2023 for these symptoms and was treated with doxycycline.  He states no ultrasound was done at that time.  His symptoms resolved until yesterday.  He had right testicular pain during the night and today.  He has had dysuria and clear penile discharge x 2-3 days.  No fever, rash, or other symptoms.    The history is provided by the patient and medical records.    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 Authorizing Provider  sulfamethoxazole-trimethoprim (BACTRIM DS) 800-160 MG tablet Take 1 tablet by mouth 2 (two) times daily for 7 days. 04/23/23 04/30/23 Yes Mickie Bail, NP  buPROPion (WELLBUTRIN XL) 150 MG 24 hr tablet Take 1 tablet (150 mg total) by mouth daily. 02/09/22   McDonough, Salomon Fick, PA-C  celecoxib (CELEBREX) 100 MG capsule Take 1 capsule (100 mg total) by mouth 2 (two) times daily as needed. 08/18/22   Jerrol Banana, MD  cetirizine (ZYRTEC) 10 MG tablet Take 1 tablet (10 mg total) by mouth daily. 03/08/20   Johnna Acosta, NP  cyclobenzaprine (FLEXERIL) 5 MG tablet Take 1 tablet (5 mg total) by mouth at bedtime. 09/16/21   McDonough, Salomon Fick, PA-C  fluticasone (FLONASE) 50 MCG/ACT nasal spray Place 2 sprays into both nostrils in the morning and at bedtime. 03/08/20   Johnna Acosta, NP  gabapentin (NEURONTIN) 300 MG capsule Take 1 capsule (300 mg total) by mouth at bedtime. 07/07/22   Jerrol Banana, MD  hydrOXYzine (ATARAX/VISTARIL) 25 MG tablet Take by mouth. 02/17/21   [provider]  indomethacin (INDOCIN) 25 MG capsule Take by mouth. 11/08/20   [provider]  omeprazole (PRILOSEC) 40 MG capsule Take 1 capsule (40 mg total) by mouth daily. 02/09/22   McDonough, Salomon Fick, PA-C  ondansetron (ZOFRAN-ODT) 4 MG disintegrating tablet Take 1 tablet (4 mg total) by mouth every 6 (six) hours as needed for nausea or vomiting. 12/10/21   Ward, Layla Maw, DO  venlafaxine XR (EFFEXOR-XR) 150 MG 24 hr capsule Take 150 mg by mouth daily with breakfast.    [provider]    Family History Family History  Problem Relation Age of Onset   Diabetes Maternal Grandmother    Diabetes Maternal Grandfather    Diabetes Paternal Grandmother     Social History Social History   Tobacco Use   Smoking status: Never  Smokeless tobacco: Never  Substance Use Topics   Alcohol use: Yes    Comment: social   Drug use: Never     Allergies   Patient has no known allergies.   Review of Systems Review of Systems  Constitutional:  Negative for chills and fever.  Gastrointestinal:  Negative for abdominal pain, nausea and vomiting.  Genitourinary:  Positive for dysuria, penile discharge and testicular pain. Negative for flank pain, hematuria and scrotal swelling.  Skin:  Negative for color change, rash and wound.     Physical Exam Triage Vital Signs ED Triage Vitals [04/23/23 1553]  Enc Vitals Group     BP      Pulse Rate 69     Resp 18     Temp 98.1 F (36.7 C)     Temp src      SpO2 97  %     Weight      Height      Head Circumference      Peak Flow      Pain Score      Pain Loc      Pain Edu?      Excl. in GC?    No data found.  Updated Vital Signs BP 120/81   Pulse 69   Temp 98.1 F (36.7 C)   Resp 18   SpO2 97%   Visual Acuity Right Eye Distance:   Left Eye Distance:   Bilateral Distance:    Right Eye Near:   Left Eye Near:    Bilateral Near:     Physical Exam Vitals and nursing note reviewed.  Constitutional:      General: He is not in acute distress.    Appearance: He is well-developed.  HENT:     Head: Normocephalic and atraumatic.  Eyes:     Conjunctiva/sclera: Conjunctivae normal.  Cardiovascular:     Rate and Rhythm: Normal rate and regular rhythm.     Heart sounds: No murmur heard. Pulmonary:     Effort: Pulmonary effort is normal. No respiratory distress.     Breath sounds: Normal breath sounds.  Abdominal:     Palpations: Abdomen is soft.     Tenderness: There is no abdominal tenderness.  Genitourinary:    Penis: Uncircumcised. Discharge present.      Testes:        Right: Tenderness present.     Epididymis:     Right: Normal.     Left: Normal.     Comments: Clear penile discharge.  Right testicular tenderness.  Musculoskeletal:        General: No swelling.     Cervical back: Neck supple.  Skin:    General: Skin is warm and dry.     Capillary Refill: Capillary refill takes less than 2 seconds.  Neurological:     Mental Status: He is alert.  Psychiatric:        Mood and Affect: Mood normal.      UC Treatments / Results  Labs (all labs ordered are listed, but only abnormal results are displayed) Labs Reviewed  POCT URINALYSIS DIP (MANUAL ENTRY) - Abnormal; Notable for the following components:      Result Value   Clarity, UA cloudy (*)    Blood, UA moderate (*)    Leukocytes, UA Large (3+) (*)    All other components within normal limits  URINE CULTURE  CYTOLOGY, (ORAL, ANAL, URETHRAL) ANCILLARY ONLY     EKG   Radiology US SCROTUM  W/DOPPLER  Result Date: 04/23/2023 CLINICAL DATA:  right testicular pain for 3 days EXAM: SCROTAL ULTRASOUND DOPPLER ULTRASOUND OF THE TESTICLES TECHNIQUE: Complete ultrasound examination of the testicles, epididymis, and other scrotal structures was performed. Color and spectral Doppler ultrasound were also utilized to evaluate blood flow to the testicles. COMPARISON:  None Available. FINDINGS: Right testicle Measurements: 5.1 x 2.3 x 3.2 cm. Scattered microlithiasis. No mass identified. Left testicle Measurements: 5.0 x 2.1 x 3.5 cm. Scattered microlithiasis. No mass identified. Right epididymis:  Normal in size and appearance. Left epididymis:  Normal in size and appearance. Hydrocele:  None visualized. Varicocele:  None visualized. Pulsed Doppler interrogation of both testes demonstrates normal low resistance arterial and venous waveforms bilaterally. IMPRESSION: 1. No evidence of testicular torsion. 2. Scattered bilateral testicular microlithiasis. Current literature suggests that testicular microlithiasis is not a significant independent risk factor for development of testicular carcinoma, and that follow up imaging is not warranted in the absence of other risk factors. Monthly testicular self-examination and annual physical exams are considered appropriate surveillance. If patient has other risk factors for testicular carcinoma, then referral to Urology should be considered. (Reference: A 5-Year Follow up Study of Asymptomatic Men with Testicular Microlithiasis. Electronically Signed   By: Larose Hires D.O.   On: 04/23/2023 17:52    Procedures Procedures (including critical care time)  Medications Ordered in UC Medications - No data to display  Initial Impression / Assessment and Plan / UC Course  I have reviewed the triage vital signs and the nursing notes.  Pertinent labs & imaging results that were available during my care of the patient were reviewed by me  and considered in my medical decision making (see chart for details).   Right testicular pain, penile discharge, dysuria.  Ultrasound of scrotum negative for torsion; shows "scattered bilateral testicular microlithiasis."  Urine culture pending.  Cytology pending.  Treating today with Bactrim.  Discussed ultrasound findings with patient via telephone.  Instructed him to follow-up with a urologist.  Contact information for on-call urologist provided.  ED precautions given.  Patient agrees to plan of care.    Final Clinical Impressions(s) / UC Diagnoses   Final diagnoses:  Penile discharge  Dysuria  Pain in right testicle     Discharge Instructions      Go to the [facility] for the ultrasound of your scrotum.  Take the Bactrim as directed.  The urine culture and cytology are pending.    Follow up with a urologist such as the one listed below.  Go to the emergency department if you have persistent or worsening symptoms.         ED Prescriptions     Medication Sig Dispense Auth. Provider   sulfamethoxazole-trimethoprim (BACTRIM DS) 800-160 MG tablet Take 1 tablet by mouth 2 (two) times daily for 7 days. 14 tablet Mickie Bail, NP      PDMP not reviewed this encounter.   Mickie Bail, NP 04/23/23 [phone]

## 2023-04-24 LAB — CYTOLOGY, (ORAL, ANAL, URETHRAL) ANCILLARY ONLY
Chlamydia: NEGATIVE
Comment: NEGATIVE
Comment: NEGATIVE
Comment: NORMAL
Neisseria Gonorrhea: NEGATIVE
Trichomonas: NEGATIVE

## 2023-04-25 LAB — URINE CULTURE: Culture: >=100000 — AB

## 2023-07-19 ENCOUNTER — Ambulatory Visit: Payer: Self-pay | Admitting: Physician Assistant

## 2023-07-19 ENCOUNTER — Telehealth: Payer: Self-pay | Admitting: Physician Assistant

## 2023-07-19 NOTE — Telephone Encounter (Signed)
Lvm to pt to reschedule cancelled appt

## 2023-07-19 NOTE — Telephone Encounter (Signed)
Patient cancelled today's appointment due to being [place] the [place]. Will call back to r/s-Evan Leach

## 2024-01-17 ENCOUNTER — Telehealth: Payer: Self-pay | Admitting: Physician Assistant

## 2024-01-17 NOTE — Telephone Encounter (Signed)
 Called patient to confirm upcoming appointment. He stated he will be going to the [place] for his appointments-Evan Leach

## 2024-01-21 ENCOUNTER — Encounter: Payer: Self-pay | Admitting: Physician Assistant
